# Patient Record
Sex: Female | Born: 1985 | Race: White | Hispanic: No | Marital: Married | Smoking: Former smoker
Health system: Southern US, Community
[De-identification: ages and names within clinical notes are randomized; demographics above are authoritative.]

## PROBLEM LIST (undated history)

## (undated) DIAGNOSIS — F4310 Post-traumatic stress disorder, unspecified: Secondary | ICD-10-CM

## (undated) DIAGNOSIS — F329 Major depressive disorder, single episode, unspecified: Secondary | ICD-10-CM

## (undated) DIAGNOSIS — N946 Dysmenorrhea, unspecified: Secondary | ICD-10-CM

## (undated) DIAGNOSIS — F419 Anxiety disorder, unspecified: Secondary | ICD-10-CM

## (undated) DIAGNOSIS — F32A Depression, unspecified: Secondary | ICD-10-CM

## (undated) DIAGNOSIS — Z720 Tobacco use: Secondary | ICD-10-CM

## (undated) DIAGNOSIS — R519 Headache, unspecified: Secondary | ICD-10-CM

## (undated) DIAGNOSIS — F1290 Cannabis use, unspecified, uncomplicated: Secondary | ICD-10-CM

## (undated) DIAGNOSIS — N83209 Unspecified ovarian cyst, unspecified side: Secondary | ICD-10-CM

## (undated) HISTORY — DX: Unspecified ovarian cyst, unspecified side: N83.209

## (undated) HISTORY — DX: Tobacco use: Z72.0

## (undated) HISTORY — DX: Cannabis use, unspecified, uncomplicated: F12.90

## (undated) HISTORY — DX: Dysmenorrhea, unspecified: N94.6

## (undated) HISTORY — PX: WISDOM TOOTH EXTRACTION: SHX21

---

## 2010-05-09 ENCOUNTER — Encounter (INDEPENDENT_AMBULATORY_CARE_PROVIDER_SITE_OTHER): Payer: Self-pay | Admitting: *Deleted

## 2010-05-09 DIAGNOSIS — R110 Nausea: Secondary | ICD-10-CM | POA: Insufficient documentation

## 2010-05-09 DIAGNOSIS — R109 Unspecified abdominal pain: Secondary | ICD-10-CM | POA: Insufficient documentation

## 2010-05-09 DIAGNOSIS — D72829 Elevated white blood cell count, unspecified: Secondary | ICD-10-CM | POA: Insufficient documentation

## 2010-05-13 ENCOUNTER — Ambulatory Visit: Payer: Self-pay | Admitting: Internal Medicine

## 2010-05-13 DIAGNOSIS — R519 Headache, unspecified: Secondary | ICD-10-CM | POA: Insufficient documentation

## 2010-05-13 DIAGNOSIS — R197 Diarrhea, unspecified: Secondary | ICD-10-CM | POA: Insufficient documentation

## 2010-05-13 DIAGNOSIS — R51 Headache: Secondary | ICD-10-CM

## 2010-05-13 DIAGNOSIS — F341 Dysthymic disorder: Secondary | ICD-10-CM

## 2010-05-16 ENCOUNTER — Ambulatory Visit: Payer: Self-pay | Admitting: Internal Medicine

## 2010-05-16 ENCOUNTER — Telehealth: Payer: Self-pay | Admitting: Internal Medicine

## 2010-05-16 LAB — CONVERTED CEMR LAB
Albumin: 4.2 g/dL (ref 3.5–5.2)
Amylase: 84 units/L (ref 27–131)
Basophils Absolute: 0.1 10*3/uL (ref 0.0–0.1)
Basophils Relative: 1 % (ref 0.0–3.0)
Eosinophils Absolute: 0.2 10*3/uL (ref 0.0–0.7)
Lipase: 44 units/L (ref 11.0–59.0)
Lymphocytes Relative: 35 % (ref 12.0–46.0)
MCHC: 34.2 g/dL (ref 30.0–36.0)
Neutrophils Relative %: 56.4 % (ref 43.0–77.0)
RBC: 4.19 M/uL (ref 3.87–5.11)
RDW: 12.9 % (ref 11.5–14.6)

## 2010-05-17 ENCOUNTER — Telehealth: Payer: Self-pay | Admitting: Internal Medicine

## 2010-05-17 ENCOUNTER — Encounter: Payer: Self-pay | Admitting: Internal Medicine

## 2010-05-17 ENCOUNTER — Encounter (INDEPENDENT_AMBULATORY_CARE_PROVIDER_SITE_OTHER): Payer: Self-pay | Admitting: *Deleted

## 2010-05-18 ENCOUNTER — Encounter: Payer: Self-pay | Admitting: Internal Medicine

## 2010-05-23 ENCOUNTER — Telehealth: Payer: Self-pay | Admitting: Internal Medicine

## 2010-05-25 ENCOUNTER — Telehealth (INDEPENDENT_AMBULATORY_CARE_PROVIDER_SITE_OTHER): Payer: Self-pay | Admitting: *Deleted

## 2010-05-31 LAB — CONVERTED CEMR LAB: Tissue Transglutaminase Ab, IgA: 1.4 units (ref <20)

## 2010-08-16 NOTE — Progress Notes (Signed)
Summary: Triage  Phone Note Call from Patient Call back at Home Phone [phone]   Caller: Patient Call For: Dr. Juanda Chance Reason for Call: Talk to Nurse Complaint: Headache Summary of Call: Having Endo today and has a terrible migraine...wants to know if she can take something.  returned pt.'s call at 1304, reached answering machine. Precious Haws left for pt. to call the # to admitting.Weston Brass Returned pt.'s call at 1326, pt. usually takes motrin for headache. Dr. Juanda Chance instructing pt.to dc this med. Informed pt. she may take tylenol.Weston Brass RN Initial call taken by: Karna Christmas,  May 16, 2010 12:33 PM

## 2010-08-16 NOTE — Letter (Signed)
Summary: Patient [facility] Biopsy Results  [facility]  [address] [place], [place] [address]   Phone: [phone]  Fax: [phone]        May 18, 2010 MRN: [id number]    KSENIA KUNZ [address] [place], [place]  [address]    Dear Ms. NICHOLSON,  I am pleased to inform you that the biopsies taken during your recent endoscopic examination did not show any evidence of cancer upon pathologic examination.  Additional information/recommendations:  __No further action is needed at this time.  Please follow-up with      your primary care physician for your other healthcare needs.  __ Please call [phone] to schedule a return visit to review      your condition.  _x_ Continue with the treatment plan as outlined on the day of your      exam.     Please call us if you are having persistent problems or have questions about your condition that have not been fully answered at this time.  Sincerely,  Hart Carwin MD  This letter has been electronically signed by your physician.  Appended Document: Patient Notice-Endo Biopsy Results letter mailed

## 2010-08-16 NOTE — Letter (Signed)
Summary: New Patient letter  [facility]  [address] [place], [place] [address]   Phone: [phone]  Fax: [phone]       05/09/2010 MRN:   Tara Mccullough [address] [place], [place]  [address]  [place]  Dear Ms. Mccullough,  Welcome to the Gastroenterology Division at [facility].    You are scheduled to see Dr.  Juanda Chance on 05-13-10 at 2:45p.m. on the 3rd floor at [facility], [address]  We ask that you try to arrive at our office 15 minutes prior to your appointment time to allow for check-in.  We would like you to complete the enclosed self-administered evaluation form prior to your visit and bring it with you on the day of your appointment.  We will review it with you.  Also, please bring a complete list of all your medications or, if you prefer, bring the medication bottles and we will list them.  Please bring your insurance card so that we may make a copy of it.  If your insurance requires a referral to see a specialist, please bring your referral form from your primary care physician.  Co-payments are due at the time of your visit and may be paid by cash, check or credit card.     Your office visit will consist of a consult with your physician (includes a physical exam), any laboratory testing he/she may order, scheduling of any necessary diagnostic testing (e.g. x-ray, ultrasound, CT-scan), and scheduling of a procedure (e.g. Endoscopy, Colonoscopy) if required.  Please allow enough time on your schedule to allow for any/all of these possibilities.    If you cannot keep your appointment, please call [phone] to cancel or reschedule prior to your appointment date.  This allows [place] the opportunity to schedule an appointment for another patient in need of care.  If you do not cancel or reschedule by 5 p.m. the business day prior to your appointment date, you will be charged a $50.00 late cancellation/no-show fee.    Thank you for  choosing [facility] for your medical needs.  We appreciate the opportunity to care for you.  Please visit [place] at our website  to learn more about our practice.                     Sincerely,                                                             The Gastroenterology Division

## 2010-08-16 NOTE — Letter (Signed)
Summary: EGD Instructions  [facility]  [address] [place], [place] [address]   Phone: [phone]  Fax: [phone]       Tara Mccullough    01-08-1986    MRN: [id number]       Procedure Day /Date: Monday 05/16/10     Arrival Time: 3 pm     Procedure Time: 4 pm     Location of Procedure:                    _x  _ [facility] (4th Floor)  PREPARATION FOR ENDOSCOPY   On 05/16/10 THE DAY OF THE PROCEDURE:  1.   No solid foods, milk or milk products are allowed after midnight the night before your procedure.  2.   Do not drink anything colored red or purple.  Avoid juices with pulp.  No orange juice.  3.  You may drink clear liquids until 2 pm, which is 2 hours before your procedure.                                                                                                CLEAR LIQUIDS INCLUDE: Water Jello Ice Popsicles Tea (sugar ok, no milk/cream) Powdered fruit flavored drinks Coffee (sugar ok, no milk/cream) Gatorade Juice: apple, white grape, white cranberry  Lemonade Clear bullion, consomm, broth Carbonated beverages (any kind) Strained chicken noodle soup Hard Candy   MEDICATION INSTRUCTIONS  Unless otherwise instructed, you should take regular prescription medications with a small sip of water as early as possible the morning of your procedure.                  OTHER INSTRUCTIONS  You will need a responsible adult at least 23 years of age to accompany you and drive you home.   This person must remain in the waiting room during your procedure.  Wear loose fitting clothing that is easily removed.  Leave jewelry and other valuables at home.  However, you may wish to bring a book to read or an iPod/MP3 player to listen to music as you wait for your procedure to start.  Remove all body piercing jewelry and leave at home.  Total time from sign-in until discharge is approximately 2-3 hours.  You should go  home directly after your procedure and rest.  You can resume normal activities the day after your procedure.  The day of your procedure you should not:   Drive   Make legal decisions   Operate machinery   Drink alcohol   Return to work  You will receive specific instructions about eating, activities and medications before you leave.    The above instructions have been reviewed and explained to me by   _______________________    I fully understand and can verbalize these instructions _____________________________ Date _________

## 2010-08-16 NOTE — Assessment & Plan Note (Signed)
Summary: elevated white count, abd pain--ch.   History of Present Illness Visit Type: Initial Consult Primary GI MD: Lina Sar MD Primary Provider: Bosie Clos, MD  Requesting Provider: Elvina Sidle, MD Chief Complaint: Upper abd pain, belching, nausea with one episode of vomiting, and diarrhea  History of Present Illness:   This is a 25 year old white female with a two-month history of epigastric pain which occurs postprandially. The pain bothers her daily; more often in the mornings. She has taken ibuprofen for headaches, usually 3-4 at a time. Since she was started on Nexium 40 mg a day one week ago, her symptoms have not improved at all. She has had nausea and occasional diarrhea. There is a history of anxiety and depression. She was found to have leukocytosis on blood tests which were completed last week. She had a white cell count of 13,000 and a left shift. There is no family history of gallbladder disease. She has no pre-existing GI history. Her father has problems with diverticulitis. She is a smoker. She drinks 1 20 oz soft drink/day. Her weight has increased by 15 pounds in the last 1.5 years.   GI Review of Systems    Reports abdominal pain, belching, nausea, and  vomiting.     Location of  Abdominal pain: upper abdomen.    Denies acid reflux, bloating, chest pain, dysphagia with liquids, dysphagia with solids, heartburn, loss of appetite, vomiting blood, weight loss, and  weight gain.      Reports change in bowel habits and  diarrhea.     Denies anal fissure, black tarry stools, constipation, diverticulosis, fecal incontinence, heme positive stool, hemorrhoids, irritable bowel syndrome, jaundice, light color stool, liver problems, rectal bleeding, and  rectal pain.    Current Medications (verified): 1)  Cymbalta 60 Mg Cpep (Duloxetine Hcl) .... Take 1 Tablet By Mouth Once A Day 2)  Sertraline Hcl 100 Mg Tabs (Sertraline Hcl) .... Take 1 Tablet By Mouth Once A Day 3)   Depo-Provera 400 Mg/ml Susp (Medroxyprogesterone Acetate) .... Inject Im Once Per Month 4)  Nexium 40 Mg Cpdr (Esomeprazole Magnesium) .... One Tablet By Mouth Once Daily  Allergies (verified): No Known Drug Allergies  Past History:  Past Medical History: DIARRHEA (ICD-787.91) HEADACHE, CHRONIC (ICD-784.0) ANXIETY DEPRESSION (ICD-300.4) NAUSEA (ICD-787.02) LEUKOCYTOSIS (ICD-288.60) ABDOMINAL PAIN, UNSPECIFIED SITE (ICD-789.00)   GERD  Past Surgical History: Unremarkable  Family History: Family History of Colon Polyps:Father  No FH of Colon Cancer:  Social History: Customer Service Rep Married No Childern Patient currently smokes.  Alcohol Use - no Daily Caffeine Use: 2 daily  Illicit Drug Use - no Smoking Status:  current Drug Use:  no  Review of Systems       The patient complains of anxiety-new, back pain, depression-new, fatigue, fever, and headaches-new.  The patient denies allergy/sinus, anemia, arthritis/joint pain, blood in urine, breast changes/lumps, change in vision, confusion, cough, coughing up blood, fainting, hearing problems, heart murmur, heart rhythm changes, itching, menstrual pain, muscle pains/cramps, night sweats, nosebleeds, pregnancy symptoms, shortness of breath, skin rash, sleeping problems, sore throat, swelling of feet/legs, swollen lymph glands, thirst - excessive, urination - excessive, urination changes/pain, urine leakage, vision changes, and voice change.         Pertinent positive and negative review of systems were noted in the above HPI. All other ROS was otherwise negative.   Vital Signs:  Patient profile:   25 year old female Height:      63 inches Weight:  133 pounds BMI:     23.65 BSA:     1.63 Pulse rate:   88 / minute Pulse rhythm:   regular BP sitting:   110 / 64  (left arm) Cuff size:   regular  Vitals Entered By: Ok Anis CMA (May 13, 2010 3:07 PM)  Physical Exam  General:  Well developed, well nourished,  no acute distress. Eyes:  PERRLA, no icterus. Mouth:  No deformity or lesions, dentition normal. Neck:  Supple; no masses or thyromegaly. Lungs:  Clear throughout to auscultation. Heart:  Regular rate and rhythm; no murmurs, rubs,  or bruits. Abdomen:  soft abdomen slightly protuberant with tenderness in midline in the epigastric area. There is no rebound and no pulsations. Right upper quadrant is mildly tender. Lower abdomen is unremarkable. Rectal:  soft Hemoccult negative stool. Extremities:  No clubbing, cyanosis, edema or deformities noted. Skin:  Intact without significant lesions or rashes. Psych:  Alert and cooperative. Normal mood and affect.   Impression & Recommendations:  Problem # 1:  LEUKOCYTOSIS (ICD-288.60) Patient predominantly has epigastric pain which is mostly postprandial. Patient has been taking excess NSAID's. 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 We need to rule out H. pylori gastritis or peptic ulcer disease. Gallbladder dysfunction would be low on my list.  Problem # 2:  ANXIETY DEPRESSION (ICD-300.4) Patient is on sertraline 100 mg once a day and Cymbalta.  Problem # 3:  DIARRHEA (ICD-787.91) Patient has symptoms suggestive of irritable bowel syndrome. She is Hemoccult-negative today.  Other Orders: T-Tissue Transglutamase Ab IgA [phone]) T-Sprue Panel (Celiac Disease Aby Eval) (83516x3/[address]) TLB-IgA (Immunoglobulin A) (82784-IGA) T- * Misc. Laboratory test [phone]) TLB-CBC Platelet - w/Differential (85025-CBCD) TLB-H. Pylori Abs(Helicobacter Pylori) (86677-HELICO) TLB-Amylase (82150-AMYL) TLB-Lipase (83690-LIPASE) TLB-Hepatic/Liver Function Pnl (80076-HEPATIC)  Patient Instructions: 1)  Hold Ibuprofen. 2)  Continue Nexium 40 mg daily. A prescription has been called in. 3)  Bentyl 10 mg p.o. before meals b.i.d. 4)  Upper endoscopy and biopsies has been scheduled. 5)  CBC liver function tests, amylase and metabolic panel including liver  function tests today as well as HPylori test and ttg testing. 6)  Substitute Tylenol for Ibuprofen. 7)  Copy sent to : Dr Kirtland Bouchard.Rice, Dr Milus Glazier 8)  The medication list was reviewed and reconciled.  All changed / newly prescribed medications were explained.  A complete medication list was provided to the patient / caregiver. Prescriptions: BENTYL 10 MG CAPS (DICYCLOMINE HCL) Take 1 capsule by mouth two times a day before meals.  #60 x 2   Entered by:   Lamona Curl CMA (AAMA)   Authorized by:   Hart Carwin MD   Signed by:   Lamona Curl CMA (AAMA) on 05/13/2010   Method used:   Electronically to        [facility] # [phone]* (retail)       [address] [place], [place]  [address]       Ph: [phone]       Fax: [phone]   RxID:   [id number] NEXIUM 40 MG CPDR (ESOMEPRAZOLE MAGNESIUM) one tablet by mouth once daily  #30 x 3   Entered by:   Lamona Curl CMA (AAMA)   Authorized by:   Hart Carwin MD   Signed by:   Lamona Curl CMA (AAMA) on 05/13/2010   Method used:   Electronically to        [facility]  [address] # [phone]* (retail)       [address] [place], [place]  [address]       Ph: [phone]       Fax: [phone]   RxID:   [id number]

## 2010-08-16 NOTE — Procedures (Signed)
Summary: Upper Endoscopy  Patient: Tara Mccullough Note: All result statuses are Final unless otherwise noted.  Tests: (1) Upper Endoscopy (EGD)   EGD Upper Endoscopy       DONE     [facility]     [address]     [place], [place]  [address]           ENDOSCOPY PROCEDURE REPORT           PATIENT:  Tara Mccullough, Tara Mccullough  MR#:  [id number]     BIRTHDATE:  Oct 28, 1985, 24 yrs. old  GENDER:  female           ENDOSCOPIST:  Hedwig Morton. Juanda Chance, MD     Referred by:  Elvina Sidle, M.D.           PROCEDURE DATE:  05/16/2010     PROCEDURE:  EGD with biopsy, [address]     ASA CLASS:  Class I     INDICATIONS:  nausea and vomiting, abdominal pain daily epigastric     pain refractory to Nexiem 40 mg/day. Took NSAID's.Hx anxiety- on     Sertraline and Cymbalta           MEDICATIONS:   Versed 10 mg, Fentanyl 100 mcg     TOPICAL ANESTHETIC:  Exactacain Spray           DESCRIPTION OF PROCEDURE:   After the risks benefits and     alternatives of the procedure were thoroughly explained, informed     consent was obtained.  The LB [id number] endoscope was     introduced through the mouth and advanced to the second portion of     the duodenum, without limitations.  The instrument was slowly     withdrawn as the mucosa was fully examined.     <<PROCEDUREIMAGES>>           The upper, middle, and distal third of the esophagus were     carefully inspected and no abnormalities were noted. The z-line     was well seen at the GEJ. The endoscope was pushed into the fundus     which was normal including a retroflexed view. The antrum,gastric     body, first and second part of the duodenum were unremarkable.     With standard forceps, a biopsy was obtained and sent to pathology.     biopsy from duodenum and from gastric antrum to r/o H (see image1,     image2, image3, image4, and image5).Pylori    Retroflexed views     revealed no abnormalities.    The scope was then withdrawn from     the  patient and the procedure completed.           COMPLICATIONS:  None           ENDOSCOPIC IMPRESSION:     1) Normal EGD     RECOMMENDATIONS:     1) Await biopsy results     Bentyl 10 mg po tid (ac), Phenergan 12.5 mg, # 20 1 po q 6 hrs     prn nausea,           REPEAT EXAM:  In 0 year(s) for.           ______________________________     Hedwig Morton. Juanda Chance, MD           CC:           n.     eSIGNED:   Hedwig Morton.  Brodie at 05/16/2010 04:20 PM           Juliene Pina, [id number]  Note: An exclamation mark (!) indicates a result that was not dispersed into the flowsheet. Document Creation Date: 05/16/2010 4:21 PM _______________________________________________________________________  (1) Order result status: Final Collection or observation date-time: 05/16/2010 16:08 Requested date-time:  Receipt date-time:  Reported date-time:  Referring Physician:   Ordering Physician: Lina Sar [phone]) Specimen Source:  Source: Launa Grill Order Number: [id number] Lab site:

## 2010-08-16 NOTE — Miscellaneous (Signed)
  Clinical Lists Changes  Medications: Removed medication of BENTYL 10 MG CAPS (DICYCLOMINE HCL) Take 1 capsule by mouth two times a day before meals.

## 2010-08-16 NOTE — Progress Notes (Signed)
Summary: results ?'s  Phone Note Call from Patient Call back at Home Phone [phone]   Caller: Patient Call For: Dr. Juanda Chance Reason for Call: Talk to Nurse Summary of Call: would like to discuss results Initial call taken by: Vallarie Mare,  May 23, 2010 9:10 AM  Follow-up for Phone Call        Patient wanted to know results of "test for infedtion". Per pathology report H. pylori is negative. Patient given the information as per path report 05/17/10. Follow-up by: Jesse Fall RN,  May 23, 2010 9:20 AM

## 2010-08-16 NOTE — Miscellaneous (Signed)
  Clinical Lists Changes 

## 2010-08-16 NOTE — Progress Notes (Signed)
Summary: throat pain post EGD  Phone Note Call from Patient Call back at Home Phone [phone]   Caller: Patient Call For: Dr. Juanda Chance Reason for Call: Talk to Nurse Summary of Call: throat still very sore from EGD and pt would like something for the pain... [facility] [address] Initial call taken by: Vallarie Mare,  May 17, 2010 10:27 AM  Follow-up for Phone Call        Message left for patient to call back re: sore throat.Jesse Fall RN  May 17, 2010 10:32 AM  Patient returned call. States she has a "sore throat." She states she can hardly swallow and it feels"like a knot in my throat." Hurts to touch outside of throat and she is hoarse. Dr. Juanda Chance recommends salt water gargles and chloraseptic spray. Patient notified. Also, notified patient that her rx for Nexium has been changed to Omeprazole.   Follow-up by: Jesse Fall RN,  May 17, 2010 11:07 AM

## 2010-08-16 NOTE — Letter (Signed)
Summary: -CBC  -CBC   Imported By: Lamona Curl CMA (AAMA) 05/09/2010 15:36:39  _____________________________________________________________________  External Attachment:    Type:   Image     Comment:   External Document

## 2010-08-16 NOTE — Miscellaneous (Signed)
Summary: phenergan rx.  Clinical Lists Changes  Medications: Added new medication of PROMETHAZINE HCL 12.5 MG TABS (PROMETHAZINE HCL) take 1 tab every 6 hours as needed for nausea. - Signed Added new medication of DONNATAL   TABS (BELLADONNA ALK-PHENOBARBITAL) take 1 tab two times a day by mouth . - Signed Rx of PROMETHAZINE HCL 12.5 MG TABS (PROMETHAZINE HCL) take 1 tab every 6 hours as needed for nausea.;  #20 x 0;  Signed;  Entered by: Doristine Church RN II;  Authorized by: Hart Carwin MD;  Method used: Electronically to [facility] # [phone]*, [address] [place], [place], [place]  [address], Ph: [phone], Fax: [phone] Rx of DONNATAL   TABS (BELLADONNA ALK-PHENOBARBITAL) take 1 tab two times a day by mouth .;  #60 x 2;  Signed;  Entered by: Doristine Church RN II;  Authorized by: Hart Carwin MD;  Method used: Electronically to [facility] # [phone]*, [address], [place], [place], [place]  [address], Ph: [phone], Fax: [phone]    Prescriptions: DONNATAL   TABS (BELLADONNA ALK-PHENOBARBITAL) take 1 tab two times a day by mouth .  #60 x 2   Entered by:   Doristine Church RN II   Authorized by:   Hart Carwin MD   Signed by:   Doristine Church RN II on 05/16/2010   Method used:   Electronically to        [facility] # [phone]* (retail)       [address] [place], [place]  [address]       Ph: [phone]       Fax: [phone]   RxID:   [id number] PROMETHAZINE HCL 12.5 MG TABS (PROMETHAZINE HCL) take 1 tab every 6 hours as needed for nausea.  #20 x 0   Entered by:   Doristine Church RN II   Authorized by:   Hart Carwin MD   Signed by:   Doristine Church RN II on 05/16/2010   Method used:   Electronically to        [facility] # [phone]* (retail)       [address] [place], [place]  [address]       Ph: [phone]       Fax: [phone]   RxID:   [id number]

## 2010-08-16 NOTE — Progress Notes (Signed)
  Phone Note Other Incoming   Request: Send information Summary of Call: Request for records received from [facility]. Faxed 11 pages to [phone].

## 2010-08-16 NOTE — Miscellaneous (Signed)
Summary: Change PPI     Clinical Lists Changes  Medications: Changed medication from NEXIUM 40 MG CPDR (ESOMEPRAZOLE MAGNESIUM) one tablet by mouth once daily to PRILOSEC 20 MG CPDR (OMEPRAZOLE) Take 1 tablet by mouth once a day. PHARMACY-PLEASE D/C RX FOR NEXIUM....INSURANCE WILL NOT COVER! - Signed Rx of PRILOSEC 20 MG CPDR (OMEPRAZOLE) Take 1 tablet by mouth once a day. PHARMACY-PLEASE D/C RX FOR NEXIUM....INSURANCE WILL NOT COVER!;  #30 x 3;  Signed;  Entered by: Lamona Curl CMA (AAMA);  Authorized by: Hart Carwin MD;  Method used: Electronically to [facility] # [phone]*, [address], [place], [place], [place]  [address], Ph: [phone], Fax: [phone]    Prescriptions: PRILOSEC 20 MG CPDR (OMEPRAZOLE) Take 1 tablet by mouth once a day. PHARMACY-PLEASE D/C RX FOR NEXIUM....INSURANCE WILL NOT COVER!  #30 x 3   Entered by:   Lamona Curl CMA (AAMA)   Authorized by:   Hart Carwin MD   Signed by:   Lamona Curl CMA (AAMA) on 05/17/2010   Method used:   Electronically to        [facility] # [phone]* (retail)       [address] [place], [place]  [address]       Ph: [phone]       Fax: [phone]   RxID:   [id number]

## 2015-02-02 ENCOUNTER — Encounter: Payer: Self-pay | Admitting: Internal Medicine

## 2015-07-13 ENCOUNTER — Ambulatory Visit (INDEPENDENT_AMBULATORY_CARE_PROVIDER_SITE_OTHER): Payer: Medicaid Other

## 2015-07-13 VITALS — BP 114/73 | HR 112 | Wt 107.7 lb

## 2015-07-13 DIAGNOSIS — Z3687 Encounter for antenatal screening for uncertain dates: Secondary | ICD-10-CM

## 2015-07-13 DIAGNOSIS — Z3491 Encounter for supervision of normal pregnancy, unspecified, first trimester: Secondary | ICD-10-CM

## 2015-07-13 DIAGNOSIS — Z720 Tobacco use: Secondary | ICD-10-CM

## 2015-07-13 DIAGNOSIS — Z36 Encounter for antenatal screening of mother: Secondary | ICD-10-CM

## 2015-07-13 DIAGNOSIS — F1210 Cannabis abuse, uncomplicated: Secondary | ICD-10-CM

## 2015-07-13 NOTE — Patient Instructions (Signed)
.Pregnancy and Zika Virus Disease Zika virus disease, or Zika, is an illness that can spread to people from mosquitoes that carry the virus. It may also spread from person to person through infected body fluids. Zika first occurred in [place], but recently it has spread to new areas. The virus occurs in tropical climates. The location of Zika continues to change. Most people who become infected with Zika virus do not develop serious illness. However, Zika may cause birth defects in an unborn baby whose mother is infected with the virus. It may also increase the risk of miscarriage. WHAT ARE THE SYMPTOMS OF ZIKA VIRUS DISEASE? In many cases, people who have been infected with Zika virus do not develop any symptoms. If symptoms appear, they usually start about a week after the person is infected. Symptoms are usually mild. They may include:  Fever.  Rash.  Red eyes.  Joint pain. HOW DOES ZIKA VIRUS DISEASE SPREAD? The main way that Zika virus spreads is through the bite of a certain type of mosquito. Unlike most types of mosquitos, which bite only at night, the type of mosquito that carries Zika virus bites both at night and during the day. Zika virus can also spread through sexual contact, through a blood transfusion, and from a mother to her baby before or during birth. Once you have had Zika virus disease, it is unlikely that you will get it again. CAN I PASS ZIKA TO MY BABY DURING PREGNANCY? Yes, Zika can pass from a mother to her baby before or during birth. WHAT PROBLEMS CAN ZIKA CAUSE FOR MY BABY? A woman who is infected with Zika virus while pregnant is at risk of having her baby born with a condition in which the brain or head is smaller than expected (microcephaly). Babies who have microcephaly can have developmental delays, seizures, hearing problems, and vision problems. Having Zika virus disease during pregnancy can also increase the risk of miscarriage. HOW CAN ZIKA VIRUS DISEASE BE  PREVENTED? There is no vaccine to prevent Zika. The best way to prevent the disease is to avoid infected mosquitoes and avoid exposure to body fluids that can spread the virus. Avoid any possible exposure to Zika by taking the following precautions. For women and their sex partners:  Avoid traveling to high-risk areas. The locations where [place] is being reported change often. To identify high-risk areas, check the CDC travel website: http://davidson-gomez.com/  If you or your sex partner must travel to a high-risk area, talk with a health care provider before and after traveling.  Take all precautions to avoid mosquito bites if you live in, or travel to, any of the high-risk areas. Insect repellents are safe to use during pregnancy.  Ask your health care provider when it is safe to have sexual contact. For women:  If you are pregnant or trying to become pregnant, avoid sexual contact with persons who may have been exposed to [place] virus, persons who have possible symptoms of Zika, or persons whose history you are unsure about. If you choose to have sexual contact with someone who may have been exposed to [place] virus, use condoms correctly during the entire duration of sexual activity, every time. Do not share sexual devices, as you may be exposed to body fluids.  Ask your health care provider about when it is safe to attempt pregnancy after a possible exposure to Zika virus. WHAT STEPS SHOULD I TAKE TO AVOID MOSQUITO BITES? Take these steps to avoid mosquito bites when you are  in a high-risk area:  Wear loose clothing that covers your arms and legs.  Limit your outdoor activities.  Do not open windows unless they have window screens.  Sleep under mosquito nets.  Use insect repellent. The best insect repellents have:  DEET, picaridin, oil of lemon eucalyptus (OLE), or IR3535 in them.  Higher amounts of an active ingredient in them.  Remember that insect repellents are safe to use  during pregnancy.  Do not use OLE on children who are younger than 36 years of age. Do not use insect repellent on babies who are younger than 82 months of age.  Cover your child's stroller with mosquito netting. Make sure the netting fits snugly and that any loose netting does not cover your child's mouth or nose. Do not use a blanket as a mosquito-protection cover.  Do not apply insect repellent underneath clothing.  If you are using sunscreen, apply the sunscreen before applying the insect repellent.  Treat clothing with permethrin. Do not apply permethrin directly to your skin. Follow label directions for safe use.  Get rid of standing water, where mosquitoes may reproduce. Standing water is often found in items such as buckets, bowls, animal food dishes, and flowerpots. When you return from traveling to any high-risk area, continue taking actions to protect yourself against mosquito bites for 3 weeks, even if you show no signs of illness. This will prevent spreading Zika virus to uninfected mosquitoes. WHAT SHOULD I KNOW ABOUT THE SEXUAL TRANSMISSION OF ZIKA? People can spread Zika to their sexual partners during vaginal, anal, or oral sex, or by sharing sexual devices. Many people with [place] do not develop symptoms, so a person could spread the disease without knowing that they are infected. The greatest risk is to women who are pregnant or who may become pregnant. Zika virus can live longer in semen than it can live in blood. Couples can prevent sexual transmission of the virus by:  Using condoms correctly during the entire duration of sexual activity, every time. This includes vaginal, anal, and oral sex.  Not sharing sexual devices. Sharing increases your risk of being exposed to body fluid from another person.  Avoiding all sexual activity until your health care provider says it is safe. SHOULD I BE TESTED FOR ZIKA VIRUS? A sample of your blood can be tested for Zika virus. A pregnant  woman should be tested if she may have been exposed to the virus or if she has symptoms of Zika. She may also have additional tests done during her pregnancy, such ultrasound testing. Talk with your health care provider about which tests are recommended.   This information is not intended to replace advice given to you by your health care provider. Make sure you discuss any questions you have with your health care provider.   Document Released: 03/24/2015 Document Reviewed: 03/17/2015 Elsevier Interactive Patient Education [employer] First Trimester of Pregnancy The first trimester of pregnancy is from week 1 until the end of week 12 (months 1 through 3). A week after a sperm fertilizes an egg, the egg will implant on the wall of the uterus. This embryo will begin to develop into a baby. Genes from you and your partner are forming the baby. The female genes determine whether the baby is a boy or a girl. At 6-8 weeks, the eyes and face are formed, and the heartbeat can be seen on ultrasound. At the end of 12 weeks, all the baby's organs are formed.  Now  that you are pregnant, you will want to do everything you can to have a healthy baby. Two of the most important things are to get good prenatal care and to follow your health care provider's instructions. Prenatal care is all the medical care you receive before the baby's birth. This care will help prevent, find, and treat any problems during the pregnancy and childbirth. BODY CHANGES Your body goes through many changes during pregnancy. The changes vary from woman to woman.   You may gain or lose a couple of pounds at first.  You may feel sick to your stomach (nauseous) and throw up (vomit). If the vomiting is uncontrollable, call your health care provider.  You may tire easily.  You may develop headaches that can be relieved by medicines approved by your health care provider.  You may urinate more often. Painful urination may mean you have  a bladder infection.  You may develop heartburn as a result of your pregnancy.  You may develop constipation because certain hormones are causing the muscles that push waste through your intestines to slow down.  You may develop hemorrhoids or swollen, bulging veins (varicose veins).  Your breasts may begin to grow larger and become tender. Your nipples may stick out more, and the tissue that surrounds them (areola) may become darker.  Your gums may bleed and may be sensitive to brushing and flossing.  Dark spots or blotches (chloasma, mask of pregnancy) may develop on your face. This will likely fade after the baby is born.  Your menstrual periods will stop.  You may have a loss of appetite.  You may develop cravings for certain kinds of food.  You may have changes in your emotions from day to day, such as being excited to be pregnant or being concerned that something may go wrong with the pregnancy and baby.  You may have more vivid and strange dreams.  You may have changes in your hair. These can include thickening of your hair, rapid growth, and changes in texture. Some women also have hair loss during or after pregnancy, or hair that feels dry or thin. Your hair will most likely return to normal after your baby is born. WHAT TO EXPECT AT YOUR PRENATAL VISITS During a routine prenatal visit:  You will be weighed to make sure you and the baby are growing normally.  Your blood pressure will be taken.  Your abdomen will be measured to track your baby's growth.  The fetal heartbeat will be listened to starting around week 10 or 12 of your pregnancy.  Test results from any previous visits will be discussed. Your health care provider may ask you:  How you are feeling.  If you are feeling the baby move.  If you have had any abnormal symptoms, such as leaking fluid, bleeding, severe headaches, or abdominal cramping.  If you are using any tobacco products, including  cigarettes, chewing tobacco, and electronic cigarettes.  If you have any questions. Other tests that may be performed during your first trimester include:  Blood tests to find your blood type and to check for the presence of any previous infections. They will also be used to check for low iron levels (anemia) and Rh antibodies. Later in the pregnancy, blood tests for diabetes will be done along with other tests if problems develop.  Urine tests to check for infections, diabetes, or protein in the urine.  An ultrasound to confirm the proper growth and development of the baby.  An  amniocentesis to check for possible genetic problems.  Fetal screens for spina bifida and Down syndrome.  You may need other tests to make sure you and the baby are doing well.  HIV (human immunodeficiency virus) testing. Routine prenatal testing includes screening for HIV, unless you choose not to have this test. HOME CARE INSTRUCTIONS  Medicines  Follow your health care provider's instructions regarding medicine use. Specific medicines may be either safe or unsafe to take during pregnancy.  Take your prenatal vitamins as directed.  If you develop constipation, try taking a stool softener if your health care provider approves. Diet  Eat regular, well-balanced meals. Choose a variety of foods, such as meat or vegetable-based protein, fish, milk and low-fat dairy products, vegetables, fruits, and whole grain breads and cereals. Your health care provider will help you determine the amount of weight gain that is right for you.  Avoid raw meat and uncooked cheese. These carry germs that can cause birth defects in the baby.  Eating four or five small meals rather than three large meals a day may help relieve nausea and vomiting. If you start to feel nauseous, eating a few soda crackers can be helpful. Drinking liquids between meals instead of during meals also seems to help nausea and vomiting.  If you develop  constipation, eat more high-fiber foods, such as fresh vegetables or fruit and whole grains. Drink enough fluids to keep your urine clear or pale yellow. Activity and Exercise  Exercise only as directed by your health care provider. Exercising will help you:  Control your weight.  Stay in shape.  Be prepared for labor and delivery.  Experiencing pain or cramping in the lower abdomen or low back is a good sign that you should stop exercising. Check with your health care provider before continuing normal exercises.  Try to avoid standing for long periods of time. Move your legs often if you must stand in one place for a long time.  Avoid heavy lifting.  Wear low-heeled shoes, and practice good posture.  You may continue to have sex unless your health care provider directs you otherwise. Relief of Pain or Discomfort  Wear a good support bra for breast tenderness.   Take warm sitz baths to soothe any pain or discomfort caused by hemorrhoids. Use hemorrhoid cream if your health care provider approves.   Rest with your legs elevated if you have leg cramps or low back pain.  If you develop varicose veins in your legs, wear support hose. Elevate your feet for 15 minutes, 3-4 times a day. Limit salt in your diet. Prenatal Care  Schedule your prenatal visits by the twelfth week of pregnancy. They are usually scheduled monthly at first, then more often in the last 2 months before delivery.  Write down your questions. Take them to your prenatal visits.  Keep all your prenatal visits as directed by your health care provider. Safety  Wear your seat belt at all times when driving.  Make a list of emergency phone numbers, including numbers for family, friends, the hospital, and police and fire departments. General Tips  Ask your health care provider for a referral to a local prenatal education class. Begin classes no later than at the beginning of month 6 of your pregnancy.  Ask for  help if you have counseling or nutritional needs during pregnancy. Your health care provider can offer advice or refer you to specialists for help with various needs.  Do not use hot tubs, steam rooms,  or saunas.  Do not douche or use tampons or scented sanitary pads.  Do not cross your legs for long periods of time.  Avoid cat litter boxes and soil used by cats. These carry germs that can cause birth defects in the baby and possibly loss of the fetus by miscarriage or stillbirth.  Avoid all smoking, herbs, alcohol, and medicines not prescribed by your health care provider. Chemicals in these affect the formation and growth of the baby.  Do not use any tobacco products, including cigarettes, chewing tobacco, and electronic cigarettes. If you need help quitting, ask your health care provider. You may receive counseling support and other resources to help you quit.  Schedule a dentist appointment. At home, brush your teeth with a soft toothbrush and be gentle when you floss. SEEK MEDICAL CARE IF:   You have dizziness.  You have mild pelvic cramps, pelvic pressure, or nagging pain in the abdominal area.  You have persistent nausea, vomiting, or diarrhea.  You have a bad smelling vaginal discharge.  You have pain with urination.  You notice increased swelling in your face, hands, legs, or ankles. SEEK IMMEDIATE MEDICAL CARE IF:   You have a fever.  You are leaking fluid from your vagina.  You have spotting or bleeding from your vagina.  You have severe abdominal cramping or pain.  You have rapid weight gain or loss.  You vomit blood or material that looks like coffee grounds.  You are exposed to [place] measles and have never had them.  You are exposed to fifth disease or chickenpox.  You develop a severe headache.  You have shortness of breath.  You have any kind of trauma, such as from a fall or a car accident.   This information is not intended to replace advice  given to you by your health care provider. Make sure you discuss any questions you have with your health care provider.   Document Released: 06/27/2001 Document Revised: 07/24/2014 Document Reviewed: 05/13/2013 Elsevier Interactive Patient Education 2016 [employer] How a Baby Grows During Pregnancy Pregnancy begins when a female's sperm enters a female's egg (fertilization). This happens in one of the tubes (fallopian tubes) that connect the ovaries to the womb (uterus). The fertilized egg is called an embryo until it reaches 10 weeks. From 10 weeks until birth, it is called a fetus. The fertilized egg moves down the fallopian tube to the uterus. Then it implants into the lining of the uterus and begins to grow. The developing fetus receives oxygen and nutrients through the pregnant woman's bloodstream and the tissues that grow (placenta) to support the fetus. The placenta is the life support system for the fetus. It provides nutrition and removes waste. Learning as much as you can about your pregnancy and how your baby is developing can help you enjoy the experience. It can also make you aware of when there might be a problem and when to ask questions. HOW LONG DOES A TYPICAL PREGNANCY LAST? A pregnancy usually lasts 280 days, or about 40 weeks. Pregnancy is divided into three trimesters:  First trimester: 0-13 weeks.  Second trimester: 14-27 weeks.  Third trimester: 28-40 weeks. The day when your baby is considered ready to be born (full term) is your estimated date of delivery. HOW DOES MY BABY DEVELOP MONTH BY MONTH? First month  The fertilized egg attaches to the inside of the uterus.  Some cells will form the placenta. Others will form the fetus.  The arms, legs,  brain, spinal cord, lungs, and heart begin to develop.  At the end of the first month, the heart begins to beat. Second month  The bones, inner ear, eyelids, hands, and feet form.  The genitals develop.  By the end  of 8 weeks, all major organs are developing. Third month  All of the internal organs are forming.  Teeth develop below the gums.  Bones and muscles begin to grow. The spine can flex.  The skin is transparent.  Fingernails and toenails begin to form.  Arms and legs continue to grow longer, and hands and feet develop.  The fetus is about 3 in (7.6 cm) long. Fourth month  The placenta is completely formed.  The external sex organs, neck, outer ear, eyebrows, eyelids, and fingernails are formed.  The fetus can hear, swallow, and move its arms and legs.  The kidneys begin to produce urine.  The skin is covered with a white waxy coating (vernix) and very fine hair (lanugo). Fifth month  The fetus moves around more and can be felt for the first time (quickening).  The fetus starts to sleep and wake up and may begin to suck its finger.  The nails grow to the end of the fingers.  The organ in the digestive system that makes bile (gallbladder) functions and helps to digest the nutrients.  If your baby is a girl, eggs are present in her ovaries. If your baby is a boy, testicles start to move down into his scrotum. Sixth month  The lungs are formed, but the fetus is not yet able to breathe.  The eyes open. The brain continues to develop.  Your baby has fingerprints and toe prints. Your baby's hair grows thicker.  At the end of the second trimester, the fetus is about 9 in (22.9 cm) long. Seventh month  The fetus kicks and stretches.  The eyes are developed enough to sense changes in light.  The hands can make a grasping motion.  The fetus responds to sound. Eighth month  All organs and body systems are fully developed and functioning.  Bones harden and taste buds develop. The fetus may hiccup.  Certain areas of the brain are still developing. The skull remains soft. Ninth month  The fetus gains about  lb (0.23 kg) each week.  The lungs are fully  developed.  Patterns of sleep develop.  The fetus's head typically moves into a head-down position (vertex) in the uterus to prepare for birth. If the buttocks move into a vertex position instead, the baby is breech.  The fetus weighs 6-9 lbs (2.72-4.08 kg) and is 19-20 in (48.26-50.8 cm) long. WHAT CAN I DO TO HAVE A HEALTHY PREGNANCY AND HELP MY BABY DEVELOP? Eating and Drinking  Eat a healthy diet.  Talk with your health care provider to make sure that you are getting the nutrients that you and your baby need.  Visit www.DisposableNylon.be to learn about creating a healthy diet.  Gain a healthy amount of weight during pregnancy as advised by your health care provider. This is usually 25-35 pounds. You may need to:  Gain more if you were underweight before getting pregnant or if you are pregnant with more than one baby.  Gain less if you were overweight or obese when you got pregnant. Medicines and Vitamins  Take prenatal vitamins as directed by your health care provider. These include vitamins such as folic acid, iron, calcium, and vitamin D. They are important for healthy development.  Take  medicines only as directed by your health care provider. Read labels and ask a pharmacist or your health care provider whether over-the-counter medicines, supplements, and prescription drugs are safe to take during pregnancy. Activities  Be physically active as advised by your health care provider. Ask your health care provider to recommend activities that are safe for you to do, such as walking or swimming.  Do not participate in strenuous or extreme sports. Lifestyle  Do not drink alcohol.  Do not use any tobacco products, including cigarettes, chewing tobacco, or electronic cigarettes. If you need help quitting, ask your health care provider.  Do not use illegal drugs. Safety  Avoid exposure to mercury, lead, or other heavy metals. Ask your health care provider about common sources of  these heavy metals.  Avoid listeria infection during pregnancy. Follow these precautions:  Do not eat soft cheeses or deli meats.  Do not eat hot dogs unless they have been warmed up to the point of steaming, such as in the microwave oven.  Do not drink unpasteurized milk.  Avoid toxoplasmosis infection during pregnancy. Follow these precautions:  Do not change your cat's litter box, if you have a cat. Ask someone else to do this for you.  Wear gardening gloves while working in the yard. General Instructions  Keep all follow-up visits as directed by your health care provider. This is important. This includes prenatal care and screening tests.  Manage any chronic health conditions. Work closely with your health care provider to keep conditions, such as diabetes, under control. HOW DO I KNOW IF MY BABY IS DEVELOPING WELL? At each prenatal visit, your health care provider will do several different tests to check on your health and keep track of your baby's development. These include:  Fundal height.  Your health care provider will measure your growing belly from top to bottom using a tape measure.  Your health care provider will also feel your belly to determine your baby's position.  Heartbeat.  An ultrasound in the first trimester can confirm pregnancy and show a heartbeat, depending on how far along you are.  Your health care provider will check your baby's heart rate at every prenatal visit.  As you get closer to your delivery date, you may have regular fetal heart rate monitoring to make sure that your baby is not in distress.  Second trimester ultrasound.  This ultrasound checks your baby's development. It also indicates your baby's gender. WHAT SHOULD I DO IF I HAVE CONCERNS ABOUT MY BABY'S DEVELOPMENT? Always talk with your health care provider about any concerns that you may have.   This information is not intended to replace advice given to you by your health care  provider. Make sure you discuss any questions you have with your health care provider.   Document Released: 12/20/2007 Document Revised: 03/24/2015 Document Reviewed: 12/10/2013 Elsevier Interactive Patient Education 2016 [employer].  Commonly Asked Questions During Pregnancy  Cats: A parasite can be excreted in cat feces.  To avoid exposure you need to have another person empty the little box.  If you must empty the litter box you will need to wear gloves.  Wash your hands after handling your cat.  This parasite can also be found in raw or undercooked meat so this should also be avoided.  Colds, Sore Throats, Flu: Please check your medication sheet to see what you can take for symptoms.  If your symptoms are unrelieved by these medications please call the office.  Dental Work:  Most any dental work Agricultural consultant recommends is permitted.  X-rays should only be taken during the first trimester if absolutely necessary.  Your abdomen should be shielded with a lead apron during all x-rays.  Please notify your provider prior to receiving any x-rays.  Novocaine is fine; gas is not recommended.  If your dentist requires a note from [place] prior to dental work please call the office and we will provide one for you.  Exercise: Exercise is an important part of staying healthy during your pregnancy.  You may continue most exercises you were accustomed to prior to pregnancy.  Later in your pregnancy you will most likely notice you have difficulty with activities requiring balance like riding a bicycle.  It is important that you listen to your body and avoid activities that put you at a higher risk of falling.  Adequate rest and staying well hydrated are a must!  If you have questions about the safety of specific activities ask your provider.    Exposure to Children with illness: Try to avoid obvious exposure; report any symptoms to [place] when noted,  If you have chicken pos, red measles or mumps, you should be immune  to these diseases.   Please do not take any vaccines while pregnant unless you have checked with your OB provider.  Fetal Movement: After 28 weeks we recommend you do "kick counts" twice daily.  Lie or sit down in a calm quiet environment and count your baby movements "kicks".  You should feel your baby at least 10 times per hour.  If you have not felt 10 kicks within the first hour get up, walk around and have something sweet to eat or drink then repeat for an additional hour.  If count remains less than 10 per hour notify your provider.  Fumigating: Follow your pest control agent's advice as to how long to stay out of your home.  Ventilate the area well before re-entering.  Hemorrhoids:   Most over-the-counter preparations can be used during pregnancy.  Check your medication to see what is safe to use.  It is important to use a stool softener or fiber in your diet and to drink lots of liquids.  If hemorrhoids seem to be getting worse please call the office.   Hot Tubs:  Hot tubs Jacuzzis and saunas are not recommended while pregnant.  These increase your internal body temperature and should be avoided.  Intercourse:  Sexual intercourse is safe during pregnancy as long as you are comfortable, unless otherwise advised by your provider.  Spotting may occur after intercourse; report any bright red bleeding that is heavier than spotting.  Labor:  If you know that you are in labor, please go to the hospital.  If you are unsure, please call the office and let us help you decide what to do.  Lifting, straining, etc:  If your job requires heavy lifting or straining please check with your provider for any limitations.  Generally, you should not lift items heavier than that you can lift simply with your hands and arms (no back muscles)  Painting:  Paint fumes do not harm your pregnancy, but may make you ill and should be avoided if possible.  Latex or water based paints have less odor than oils.  Use adequate  ventilation while painting.  Permanents & Hair Color:  Chemicals in hair dyes are not recommended as they cause increase hair dryness which can increase hair loss during pregnancy.  " Highlighting" and permanents  are allowed.  Dye may be absorbed differently and permanents may not hold as well during pregnancy.  Sunbathing:  Use a sunscreen, as skin burns easily during pregnancy.  Drink plenty of fluids; avoid over heating.  Tanning Beds:  Because their possible side effects are still unknown, tanning beds are not recommended.  Ultrasound Scans:  Routine ultrasounds are performed at approximately 20 weeks.  You will be able to see your baby's general anatomy an if you would like to know the gender this can usually be determined as well.  If it is questionable when you conceived you may also receive an ultrasound early in your pregnancy for dating purposes.  Otherwise ultrasound exams are not routinely performed unless there is a medical necessity.  Although you can request a scan we ask that you pay for it when conducted because insurance does not cover " patient request" scans.  Work: If your pregnancy proceeds without complications you may work until your due date, unless your physician or employer advises otherwise.  Round Ligament Pain/Pelvic Discomfort:  Sharp, shooting pains not associated with bleeding are fairly common, usually occurring in the second trimester of pregnancy.  They tend to be worse when standing up or when you remain standing for long periods of time.  These are the result of pressure of certain pelvic ligaments called "round ligaments".  Rest, Tylenol and heat seem to be the most effective relief.  As the womb and fetus grow, they rise out of the pelvis and the discomfort improves.  Please notify the office if your pain seems different than that described.  It may represent a more serious condition.

## 2015-07-13 NOTE — Progress Notes (Signed)
Tara FinnerKristina Mccullough presents for NOB nurse interview visit. Pos upt at [facility] 06/21/2015. G-1.  P-0. Unsure of lmp- (approx)- 05/18/2015-  EDD- 02/22/2016- 8 wk. Pregnancy education material explained and given. __0_ cats in the home. NOB labs ordered. HIV labs and Drug screen were explained and ordered. PNV encouraged. NT to discuss with provider. Needs flu vaccine at 16 wks. Needs tdap at 28 weeks. Last pap 2013- wnl. No h/o abn pap. Tobacco user- at least 10 cigs qd. Trying to quit. Smoked marijuana daily prior to pos HPT. Last used 06/16/2015.  U/S for dating in 1-2 weeks. Pt. To follow up with Tara DecemberSharon  in _3_ weeks for NOB physical.  All questions answered.

## 2015-07-14 ENCOUNTER — Telehealth: Payer: Self-pay | Admitting: Certified Nurse Midwife

## 2015-07-14 ENCOUNTER — Encounter: Payer: Self-pay | Admitting: Certified Nurse Midwife

## 2015-07-14 DIAGNOSIS — Z6791 Unspecified blood type, Rh negative: Secondary | ICD-10-CM | POA: Insufficient documentation

## 2015-07-14 DIAGNOSIS — O26899 Other specified pregnancy related conditions, unspecified trimester: Secondary | ICD-10-CM

## 2015-07-14 DIAGNOSIS — O219 Vomiting of pregnancy, unspecified: Secondary | ICD-10-CM

## 2015-07-14 LAB — CBC WITH DIFFERENTIAL/PLATELET
BASOS ABS: 0 10*3/uL (ref 0.0–0.2)
Basos: 0 %
EOS (ABSOLUTE): 0.2 10*3/uL (ref 0.0–0.4)
Eos: 2 %
HEMOGLOBIN: 13.8 g/dL (ref 11.1–15.9)
Hematocrit: 40.6 % (ref 34.0–46.6)
Immature Grans (Abs): 0 10*3/uL (ref 0.0–0.1)
Immature Granulocytes: 0 %
LYMPHS ABS: 2.9 10*3/uL (ref 0.7–3.1)
Lymphs: 25 %
MCH: 31.2 pg (ref 26.6–33.0)
MCHC: 34 g/dL (ref 31.5–35.7)
MCV: 92 fL (ref 79–97)
MONOCYTES: 8 %
Monocytes Absolute: 1 10*3/uL — ABNORMAL HIGH (ref 0.1–0.9)
Neutrophils Absolute: 7.5 10*3/uL — ABNORMAL HIGH (ref 1.4–7.0)
Neutrophils: 65 %
PLATELETS: 291 10*3/uL (ref 150–379)
RBC: 4.42 x10E6/uL (ref 3.77–5.28)
RDW: 13 % (ref 12.3–15.4)
WBC: 11.7 10*3/uL — AB (ref 3.4–10.8)

## 2015-07-14 LAB — ABO AND RH: RH TYPE: NEGATIVE

## 2015-07-14 LAB — ANTIBODY SCREEN: ANTIBODY SCREEN: NEGATIVE

## 2015-07-14 LAB — URINALYSIS, ROUTINE W REFLEX MICROSCOPIC
Bilirubin, UA: NEGATIVE
Glucose, UA: NEGATIVE
Ketones, UA: NEGATIVE
Leukocytes, UA: NEGATIVE
NITRITE UA: NEGATIVE
Protein, UA: NEGATIVE
RBC, UA: NEGATIVE
Specific Gravity, UA: 1.027 (ref 1.005–1.030)
Urobilinogen, Ur: 0.2 mg/dL (ref 0.2–1.0)
pH, UA: 6.5 (ref 5.0–7.5)

## 2015-07-14 LAB — URINE CULTURE: ORGANISM ID, BACTERIA: NO GROWTH

## 2015-07-14 LAB — VARICELLA ZOSTER ANTIBODY, IGG: Varicella zoster IgG: 360 index (ref >165)

## 2015-07-14 LAB — GC/CHLAMYDIA PROBE AMP
CHLAMYDIA, DNA PROBE: NEGATIVE
NEISSERIA GONORRHOEAE BY PCR: NEGATIVE

## 2015-07-14 LAB — HIV ANTIBODY (ROUTINE TESTING W REFLEX): HIV Screen 4th Generation wRfx: NONREACTIVE

## 2015-07-14 LAB — RPR: RPR: NONREACTIVE

## 2015-07-14 LAB — RUBELLA SCREEN: RUBELLA: 2.17 {index} (ref >0.99)

## 2015-07-14 LAB — HEPATITIS B SURFACE ANTIGEN: Hepatitis B Surface Ag: NEGATIVE

## 2015-07-14 MED ORDER — PROMETHAZINE HCL 25 MG PO TABS: 25.0000 mg | ORAL_TABLET | Freq: Four times a day (QID) | ORAL | Status: DC | PRN

## 2015-07-14 NOTE — Telephone Encounter (Signed)
Pt is [redacted] wks pregnant and is very nauseated., throwing up a lot. Can she have something sent to pharmacy. [facility] [address])

## 2015-07-14 NOTE — Telephone Encounter (Signed)
I spoke with pt and she is currently on break from school and is able to take phenergan po so it was decided this would be ordered. She just got her pregnancy medicaid approved. It seems to be every other day with n/v. Pt encouraged to drink lots of fluid, eat foods such as saltine crackers, dry cereal, toast, etc. She has already tried the Vitamin B6 and did not notice that this helped. To contact office if no improvement.

## 2015-07-15 LAB — "PAIN MGT SCRN (14 DRUGS), UR                    "
Amphetamine Screen, Ur: NEGATIVE ng/mL
Barbiturate Screen, Ur: NEGATIVE ng/mL
Benzodiazepine Screen, Urine: NEGATIVE ng/mL
Buprenorphine, Urine: NEGATIVE ng/mL
Cannabinoids Ur Ql Scn: POSITIVE ng/mL
Cocaine(Metab.)Screen, Urine: NEGATIVE ng/mL
Creatinine(Crt), U: 199.8 mg/dL (ref 20.0–300.0)
Fentanyl, Urine: NEGATIVE pg/mL
Meperidine Screen, Urine: NEGATIVE ng/mL
Methadone Scn, Ur: NEGATIVE ng/mL
Opiate Scrn, Ur: NEGATIVE ng/mL
Oxycodone+Oxymorphone Ur Ql Scn: NEGATIVE ng/mL
PCP Scrn, Ur: NEGATIVE ng/mL
Ph of Urine: 5.9 (ref 4.5–8.9)
Propoxyphene, Screen: NEGATIVE ng/mL
Tramadol Ur Ql Scn: NEGATIVE ng/mL

## 2015-07-15 LAB — NICOTINE SCREEN, URINE: Cotinine Ql Scrn, Ur: POSITIVE ng/mL

## 2015-07-20 ENCOUNTER — Ambulatory Visit (INDEPENDENT_AMBULATORY_CARE_PROVIDER_SITE_OTHER): Payer: Medicaid Other

## 2015-07-20 ENCOUNTER — Ambulatory Visit (INDEPENDENT_AMBULATORY_CARE_PROVIDER_SITE_OTHER): Payer: Medicaid Other | Admitting: Certified Nurse Midwife

## 2015-07-20 ENCOUNTER — Encounter: Payer: Self-pay | Admitting: Certified Nurse Midwife

## 2015-07-20 VITALS — BP 103/67 | HR 95 | Wt 108.4 lb

## 2015-07-20 DIAGNOSIS — Z36 Encounter for antenatal screening of mother: Secondary | ICD-10-CM

## 2015-07-20 DIAGNOSIS — O021 Missed abortion: Secondary | ICD-10-CM | POA: Diagnosis not present

## 2015-07-20 DIAGNOSIS — R825 Elevated urine levels of drugs, medicaments and biological substances: Secondary | ICD-10-CM | POA: Insufficient documentation

## 2015-07-20 DIAGNOSIS — Z3687 Encounter for antenatal screening for uncertain dates: Secondary | ICD-10-CM

## 2015-07-20 NOTE — Patient Instructions (Signed)
Call if bleeding or heavy bleeding

## 2015-07-20 NOTE — Progress Notes (Signed)
Sonogram completed for viability.  Patient unsure about LMP thinks it was 05/18/15.  Patient denies cramping or spotting but states nausea has stopped.  Patient tearful about the report below  ULTRASOUND REPORT  Location: [facility] Date of Service: 07/20/15  Indications:viability Findings:  Singleton intrauterine pregnancy is visualized with a CRL consistent with 6 1/[redacted] weeks gestation, giving an (U/S) EDD of 03/05/16 . The (U/S) EDD is NOT consistent with the clinically established (LMP) EDD of 02/22/16.  FHR: not seen CRL measurement: 4.2 mm Yolk sac appears normal. Large Gestational sac consistent with 8 2/7 weeks pregnancy. This is not consistent with CRL measuring 6 1/7 weeks.   Right Ovary measures 2.7 x 1.9 x 2.2 cm. It is normal in appearance. Left Ovary measures 2.9 x 1.7 x 2.6 cm. It is normal appearance. There is evidence of a corpus luteal cyst in the Left Survey of the adnexa demonstrates no adnexal masses. There is no free peritoneal fluid in the cul de sac.  Impression: 1. 6 1/7 week Singleton Intrauterine pregnancy by U/S. 2. (U/S) EDD is NOT consistent with Clinically established (LMP) EDD of 02/22/16 . 3. Large Gestational sac consistent with 8 2/7 weeks pregnancy. This is not consistent with CRL measuring 6 1/7 weeks. 4. No fetal heart beat seen  Plan: Repeat sonogram in 1 week Will plan for Rhogam if missed ab next visit Will discuss options of D & C verses cytotec if non viable in 1 week.

## 2015-07-21 ENCOUNTER — Telehealth: Payer: Self-pay | Admitting: Certified Nurse Midwife

## 2015-07-21 NOTE — Telephone Encounter (Signed)
PT WANTS TO KNOW IF SHE CAN COME IN THIS WEEK FOR BETA HCG TO SEE IF HER LEVELS ARE UP.

## 2015-07-21 NOTE — Telephone Encounter (Signed)
Female answered phone and states could I call back in 5 min. For pt., I told him I was from the doctors office and would try to do that.

## 2015-07-23 ENCOUNTER — Other Ambulatory Visit: Payer: Self-pay | Admitting: Certified Nurse Midwife

## 2015-07-23 DIAGNOSIS — O3680X0 Pregnancy with inconclusive fetal viability, not applicable or unspecified: Secondary | ICD-10-CM

## 2015-07-23 NOTE — Telephone Encounter (Signed)
Pt notified that we do not have an earlier Bhcg level and since she is having an ultrasound on Tuesday this would not benefit at this time. Pt voiced understanding.

## 2015-07-27 ENCOUNTER — Other Ambulatory Visit: Payer: Medicaid Other

## 2015-07-27 ENCOUNTER — Ambulatory Visit (INDEPENDENT_AMBULATORY_CARE_PROVIDER_SITE_OTHER): Payer: Medicaid Other | Admitting: Obstetrics and Gynecology

## 2015-07-27 ENCOUNTER — Encounter: Payer: Medicaid Other | Admitting: Certified Nurse Midwife

## 2015-07-27 ENCOUNTER — Ambulatory Visit (INDEPENDENT_AMBULATORY_CARE_PROVIDER_SITE_OTHER): Payer: Medicaid Other

## 2015-07-27 ENCOUNTER — Encounter: Payer: Self-pay | Admitting: Obstetrics and Gynecology

## 2015-07-27 VITALS — BP 101/66 | HR 105 | Wt 109.0 lb

## 2015-07-27 DIAGNOSIS — O3680X0 Pregnancy with inconclusive fetal viability, not applicable or unspecified: Secondary | ICD-10-CM | POA: Diagnosis not present

## 2015-07-27 DIAGNOSIS — Z6791 Unspecified blood type, Rh negative: Secondary | ICD-10-CM

## 2015-07-27 DIAGNOSIS — O021 Missed abortion: Secondary | ICD-10-CM | POA: Diagnosis not present

## 2015-07-27 MED ORDER — RHO D IMMUNE GLOBULIN 1500 UNITS IM SOSY
1500.0000 [IU] | PREFILLED_SYRINGE | Freq: Once | INTRAMUSCULAR | Status: AC
  Administered 2015-07-27: 1500 [IU] via INTRAMUSCULAR

## 2015-07-27 NOTE — Progress Notes (Signed)
OB problem/operative note: Patient has embryonic demise based on ultrasound Which demonstrated no fetal cardiac activity and a 6.2 week crown-rump length fetus associated with an enlarged, irregular shaped gestational sac.  She is not experiencing any bleeding or cramping.  She is Rh- and has received RhoGAM today.  Options of management have been reviewed and the decision is made for suction D&C to be performed on 08/02/2015.  Preoperative counseling and history and physical were completed today.  A total of 15 minutes were spent face-to-face with the patient during this encounter and over half of that time dealt with counseling and coordination of care.  Herold HarmsMartin A Jeramyah Goodpasture, MD

## 2015-07-27 NOTE — H&P (Signed)
Subjective:    Patient is a 30 y.o. G1P13female scheduled for Suction D&C. Indications for procedure are First trimester Embryonic Demise. EGA [redacted] weeks. U/S x 2 No FCA; CRL 4.8 mm; Gestational sac - teardrop irregular.  Patient is Rh- and status post RhoGAM therapy on 07/27/2015  Pertinent Gynecological History: Menses: 05/18/15 Bleeding: none Contraception: NA Last mammogram: NA Last pap: unknown     Menstrual History: OB History    Gravida Para Term Preterm AB TAB SAB Ectopic Multiple Living   1               Menarche age: na  Patient's last menstrual period was 05/18/2015 (approximate).    Past Medical History  Diagnosis Date  . Ovarian cyst   . Painful menstrual periods   . Tobacco user   . Uses marijuana     last used 06/16/2015- previously used daily    Past Surgical History  Procedure Laterality Date  . Wisdom tooth extraction      OB History  Gravida Para Term Preterm AB SAB TAB Ectopic Multiple Living  1             # Outcome Date GA Lbr Len/2nd Weight Sex Delivery Anes PTL Lv  1 Current               Social History   Social History  . Marital Status: Divorced    Spouse Name: N/A  . Number of Children: N/A  . Years of Education: N/A   Social History Main Topics  . Smoking status: Current Every Day Smoker -- 0.50 packs/day    Types: Cigarettes  . Smokeless tobacco: None  . Alcohol Use: Yes     Comment: occas  . Drug Use: Yes    Special: Marijuana     Comment: 06/16/2015 last used- prior to pregnancy used MJ- qd  . Sexual Activity: Yes    Birth Control/ Protection: None   Other Topics Concern  . None   Social History Narrative    Family History  Problem Relation Age of Onset  . Heart disease Father   . Diabetes Maternal Aunt   . Heart disease Maternal Grandmother   . Diabetes Maternal Grandfather   . Heart disease Paternal Grandmother   . Cancer Neg Hx      (Not in a hospital admission)  No Known Allergies  Review of  Systems Constitutional: No recent fever/chills/sweats Respiratory: No recent cough/bronchitis Cardiovascular: No chest pain Gastrointestinal: No recent nausea/vomiting/diarrhea Genitourinary: No UTI symptoms Hematologic/lymphatic:No history of coagulopathy or recent blood thinner use    Objective:    BP 101/66 mmHg  Pulse 105  Wt 109 lb (49.442 kg)  LMP 05/18/2015 (Approximate)  General:   Normal  Skin:   normal  HEENT:  Normal  Neck:  Supple without Adenopathy or Thyromegaly  Lungs:   Heart:              Breasts:   Abdomen:  Pelvis:  M/S   Extremeties:  Neuro:    clear to auscultation bilaterally   Normal without murmur   Not Examined   soft, non-tender; bowel sounds normal; no masses,  no organomegaly   Exam deferred to OR  No CVAT  Warm/Dry   Normal          Assessment:    First trimester embryonic demise. Rh-, status post RhoGAM   Plan:  Suction D&C.  Preoperative counseling: Patient has been counseled regarding the upcoming procedure.  She is  understanding and accepting of all surgical risks which include but are not limited to bleeding, infection, pelvic organ injury with need for repair, blood disorders, anesthesia risks, etc.  All questions have been answered.  Informed consent is given.  Patient is ready and willing to proceed with surgery as scheduled. Preop testing ordered. Instructions reviewed, including NPO after midnight.

## 2015-07-29 ENCOUNTER — Encounter
Admission: RE | Admit: 2015-07-29 | Discharge: 2015-07-29 | Disposition: A | Discharge status: Home / Self Care | Payer: Medicaid Other | Source: Ambulatory Visit | Attending: Obstetrics and Gynecology | Admitting: Obstetrics and Gynecology

## 2015-07-29 DIAGNOSIS — Z01812 Encounter for preprocedural laboratory examination: Secondary | ICD-10-CM | POA: Insufficient documentation

## 2015-07-29 NOTE — Pre-Procedure Instructions (Signed)
Dr. Pricilla Holmefranseco OK'd dc of repeat blood test due to recent results being adequate.  No need for repeat rhogam the day of surgery.  She received it in the office. Will repeat Type and Screen AM of surgery so it will be within the 3 day guideline.

## 2015-07-29 NOTE — Patient Instructions (Signed)
  Your procedure is scheduled on:08/02/15 Report to Day Surgery. To find out your arrival time please call [phone] between 1PM - 3PM on today.  Remember: Instructions that are not followed completely may result in serious medical risk, up to and including death, or upon the discretion of your surgeon and anesthesiologist your surgery may need to be rescheduled.    __x__ 1. Do not eat food or drink liquids after midnight. No gum chewing or hard candies.     __x__ 2. No Alcohol for 24 hours before or after surgery.   ____ 3. Bring all medications with you on the day of surgery if instructed.    __x__ 4. Notify your doctor if there is any change in your medical condition     (cold, fever, infections).     Do not wear jewelry, make-up, hairpins, clips or nail polish.  Do not wear lotions, powders, or perfumes. You may wear deodorant.  Do not shave 48 hours prior to surgery. Men may shave face and neck.  Do not bring valuables to the hospital.    [facility] is not responsible for any belongings or valuables.               Contacts, dentures or bridgework may not be worn into surgery.  Leave your suitcase in the car. After surgery it may be brought to your room.  For patients admitted to the hospital, discharge time is determined by your                treatment team.   Patients discharged the day of surgery will not be allowed to drive home.   Please read over the following fact sheets that you were given:   Surgical Site Infection Prevention   ____ Take these medicines the morning of surgery with A SIP OF WATER:    1.   2.   3.   4.  5.  6.  ____ Fleet Enema (as directed)   ____ Use CHG Soap as directed  ____ Use inhalers on the day of surgery  ____ Stop metformin 2 days prior to surgery    ____ Take 1/2 of usual insulin dose the night before surgery and none on the morning of surgery.   ____ Stop Coumadin/Plavix/aspirin on   ____ Stop Anti-inflammatories on  today Tylenol on ly   ____ Stop supplements until after surgery.    ____ Bring C-Pap to the hospital.

## 2015-07-30 ENCOUNTER — Encounter: Payer: Medicaid Other | Admitting: Obstetrics and Gynecology

## 2015-08-02 ENCOUNTER — Encounter
Admission: RE | Disposition: A | Discharge status: Home / Self Care | Payer: Self-pay | Source: Ambulatory Visit | Attending: Obstetrics and Gynecology

## 2015-08-02 ENCOUNTER — Ambulatory Visit: Payer: Medicaid Other | Admitting: Anesthesiology

## 2015-08-02 ENCOUNTER — Encounter: Payer: Self-pay | Admitting: *Deleted

## 2015-08-02 ENCOUNTER — Ambulatory Visit
Admission: RE | Admit: 2015-08-02 | Discharge: 2015-08-02 | Disposition: A | Discharge status: Home / Self Care | Payer: Medicaid Other | Source: Ambulatory Visit | Attending: Obstetrics and Gynecology | Admitting: Obstetrics and Gynecology

## 2015-08-02 ENCOUNTER — Encounter: Payer: Medicaid Other | Admitting: Certified Nurse Midwife

## 2015-08-02 DIAGNOSIS — F17210 Nicotine dependence, cigarettes, uncomplicated: Secondary | ICD-10-CM | POA: Diagnosis not present

## 2015-08-02 DIAGNOSIS — F1290 Cannabis use, unspecified, uncomplicated: Secondary | ICD-10-CM | POA: Diagnosis not present

## 2015-08-02 DIAGNOSIS — O021 Missed abortion: Secondary | ICD-10-CM | POA: Diagnosis present

## 2015-08-02 HISTORY — PX: DILATION AND EVACUATION: SHX1459

## 2015-08-02 LAB — ABO/RH: ABO/RH(D): O NEG

## 2015-08-02 LAB — TYPE AND SCREEN
ABO/RH(D): O NEG
ANTIBODY SCREEN: POSITIVE

## 2015-08-02 SURGERY — DILATION AND EVACUATION, UTERUS
Anesthesia: General

## 2015-08-02 MED ORDER — FENTANYL CITRATE (PF) 100 MCG/2ML IJ SOLN
INTRAMUSCULAR | Status: DC | PRN
  Administered 2015-08-02 (×3): 50 ug via INTRAVENOUS

## 2015-08-02 MED ORDER — ONDANSETRON HCL 4 MG/2ML IJ SOLN
INTRAMUSCULAR | Status: DC | PRN
  Administered 2015-08-02: 4 mg via INTRAVENOUS

## 2015-08-02 MED ORDER — OXYCODONE HCL 5 MG PO TABS: 5.0000 mg | ORAL_TABLET | Freq: Once | ORAL | Status: DC | PRN

## 2015-08-02 MED ORDER — KETOROLAC TROMETHAMINE 30 MG/ML IJ SOLN
INTRAMUSCULAR | Status: DC | PRN
  Administered 2015-08-02: 30 mg via INTRAVENOUS

## 2015-08-02 MED ORDER — DEXAMETHASONE SODIUM PHOSPHATE 10 MG/ML IJ SOLN
INTRAMUSCULAR | Status: DC | PRN
  Administered 2015-08-02: 10 mg via INTRAVENOUS

## 2015-08-02 MED ORDER — OXYCODONE HCL 5 MG/5ML PO SOLN: 5.0000 mg | Freq: Once | ORAL | Status: DC | PRN

## 2015-08-02 MED ORDER — FAMOTIDINE 20 MG PO TABS
20.0000 mg | ORAL_TABLET | Freq: Once | ORAL | Status: AC
  Administered 2015-08-02: 20 mg via ORAL

## 2015-08-02 MED ORDER — OXYCODONE-ACETAMINOPHEN 5-325 MG PO TABS: 1.0000 | ORAL_TABLET | ORAL | Status: DC | PRN

## 2015-08-02 MED ORDER — FENTANYL CITRATE (PF) 100 MCG/2ML IJ SOLN
25.0000 ug | INTRAMUSCULAR | Status: DC | PRN
  Administered 2015-08-02 (×4): 25 ug via INTRAVENOUS

## 2015-08-02 MED ORDER — FAMOTIDINE 20 MG PO TABS
ORAL_TABLET | ORAL | Status: AC
  Filled 2015-08-02: qty 1

## 2015-08-02 MED ORDER — LACTATED RINGERS IV SOLN
INTRAVENOUS | Status: DC
  Administered 2015-08-02: 14:00:00 via INTRAVENOUS

## 2015-08-02 MED ORDER — FENTANYL CITRATE (PF) 100 MCG/2ML IJ SOLN
INTRAMUSCULAR | Status: AC
  Filled 2015-08-02: qty 2

## 2015-08-02 MED ORDER — ONDANSETRON HCL 4 MG/2ML IJ SOLN
INTRAMUSCULAR | Status: AC
  Administered 2015-08-02: 4 mg
  Filled 2015-08-02: qty 2

## 2015-08-02 MED ORDER — IBUPROFEN 800 MG PO TABS: 800.0000 mg | ORAL_TABLET | Freq: Three times a day (TID) | ORAL | Status: DC

## 2015-08-02 SURGICAL SUPPLY — 19 items
CATH ROBINSON RED A/P 16FR (CATHETERS) ×3 IMPLANT
DRSG TELFA 3X8 NADH (GAUZE/BANDAGES/DRESSINGS) ×3 IMPLANT
GLOVE BIO SURGEON STRL SZ8 (GLOVE) ×3 IMPLANT
GOWN STRL REUS W/ TWL LRG LVL3 (GOWN DISPOSABLE) ×1 IMPLANT
GOWN STRL REUS W/ TWL XL LVL3 (GOWN DISPOSABLE) ×1 IMPLANT
GOWN STRL REUS W/TWL LRG LVL3 (GOWN DISPOSABLE) ×2
GOWN STRL REUS W/TWL XL LVL3 (GOWN DISPOSABLE) ×2
KIT BERKELEY 1ST TRIMESTER 3/8 (MISCELLANEOUS) ×3 IMPLANT
KIT RM TURNOVER CYSTO AR (KITS) ×3 IMPLANT
PACK DNC HYST (MISCELLANEOUS) ×3 IMPLANT
PAD OB MATERNITY 4.3X12.25 (PERSONAL CARE ITEMS) ×3 IMPLANT
PAD PREP 24X41 OB/GYN DISP (PERSONAL CARE ITEMS) ×3 IMPLANT
SET BERKELEY SUCTION TUBING (SUCTIONS) ×3 IMPLANT
SOL PREP PVP 2OZ (MISCELLANEOUS) ×3
SOLUTION PREP PVP 2OZ (MISCELLANEOUS) ×1 IMPLANT
SPONGE XRAY 4X4 16PLY STRL (MISCELLANEOUS) ×3 IMPLANT
TOWEL OR 17X26 4PK STRL BLUE (TOWEL DISPOSABLE) ×3 IMPLANT
VACURETTE 10 RIGID CVD (CANNULA) ×3 IMPLANT
VACURETTE 8 RIGID CVD (CANNULA) IMPLANT

## 2015-08-02 NOTE — Transfer of Care (Signed)
Immediate Anesthesia Transfer of Care Note  Patient: Enedina FinnerKristina Bevill  Procedure(s) Performed: Procedure(s): DILATATION AND EVACUATION (N/A)  Patient Location: PACU  Anesthesia Type:General  Level of Consciousness: awake, alert  and oriented  Airway & Oxygen Therapy: Patient Spontanous Breathing and Patient connected to face mask oxygen  Post-op Assessment: Report given to RN and Post -op Vital signs reviewed and stable  Post vital signs: Reviewed and stable  Last Vitals:  Filed Vitals:   08/02/15 1332 08/02/15 1615  BP: 108/66 110/66  Pulse: 88 90  Temp:  36.9 C  Resp: 16 16    Complications: No apparent anesthesia complications

## 2015-08-02 NOTE — Anesthesia Postprocedure Evaluation (Signed)
Anesthesia Post Note  Patient: Tara Mccullough  Procedure(Mccullough) Performed: Procedure(Mccullough) (LRB): DILATATION AND EVACUATION (N/A)  Patient location during evaluation: PACU Anesthesia Type: General Level of consciousness: awake Pain management: pain level controlled Vital Signs Assessment: post-procedure vital signs reviewed and stable Respiratory status: spontaneous breathing Cardiovascular status: blood pressure returned to baseline Anesthetic complications: no    Last Vitals:  Filed Vitals:   08/02/15 1706 08/02/15 1748  BP: 115/62 110/60  Pulse: 80 83  Temp: 36.7 C   Resp: 12 14    Last Pain:  Filed Vitals:   08/02/15 1749  PainSc: 3                  Tara Mccullough

## 2015-08-02 NOTE — Anesthesia Procedure Notes (Signed)
Procedure Name: LMA Insertion Date/Time: 08/02/2015 3:42 PM Performed by: Omer JackWEATHERLY, Tiffinie Caillier Pre-anesthesia Checklist: Patient identified, Patient being monitored, Timeout performed, Emergency Drugs available and Suction available Patient Re-evaluated:Patient Re-evaluated prior to inductionOxygen Delivery Method: Circle system utilized Preoxygenation: Pre-oxygenation with 100% oxygen Intubation Type: IV induction Ventilation: Mask ventilation without difficulty LMA: LMA inserted LMA Size: 3.0 Tube type: Oral Number of attempts: 1 Placement Confirmation: positive ETCO2 and breath sounds checked- equal and bilateral Tube secured with: Tape Dental Injury: Teeth and Oropharynx as per pre-operative assessment

## 2015-08-02 NOTE — Interval H&P Note (Signed)
History and Physical Interval Note:  08/02/2015 3:15 PM  Tara FinnerKristina Simones  has presented today for surgery, with the diagnosis of EMBRYONIC DEMISE  The various methods of treatment have been discussed with the patient and family. After consideration of risks, benefits and other options for treatment, the patient has consented to  Procedure(s): DILATATION AND EVACUATION (N/A) as a surgical intervention .  The patient's history has been reviewed, patient examined, no change in status, stable for surgery.  I have reviewed the patient's chart and labs.  Questions were answered to the patient's satisfaction.     Daphine DeutscherMartin A Quirino Kakos

## 2015-08-02 NOTE — Op Note (Signed)
Tara FinnerKristina Fanara PROCEDURE DATE: 08/02/2015 3:59 PM  PREOPERATIVE DIAGNOSIS: EMBRYONIC DEMISE POSTOPERATIVE DIAGNOSIS: EMBRYONIC DEMISE PROCEDURE: Procedure(s): DILATATION AND EVACUATION (N/A) SURGEON:  Dr. Daphine DeutscherMartin A Kiannah Grunow ASSISTANT: None ANESTHESIA: General  INDICATIONS: 30 y.o. G1P0 with history ofFetal Demise at [redacted] weeks gestation presents for surgical management.   Please see preoperative notes for further details.   FINDINGS:  Uterus wasMidplane and 10 week size. Tissue consistent with products of conception was removed and sent to Pathology.   I/O's: Total I/O In: 400 [I.V.:400] Out: 225 [Urine:150; Blood:75] SPECIMENS: POC  COMPLICATIONS: None immediate COUNTS:  YES  PROCEDURE IN DETAIL: The patient was brought to the operating room where she was placed into the supine position.  General LMA anesthesia was induced without incident. She was placed in the dorsal lithotomy position using candy cane stirrups, and was examined; A Betadine prep and drape was performed in sterile fashion.   A  Red Robinson catheter was used to drain the bladder of urine. A weighted speculum was placed into  the vagina and a single tooth tenaculum was applied to the anterior lip of the cervix. The cervix was gently dilated using Hank's Dilators to accommodate a 10 mm suction curette, that was gently advanced to the uterine fundus.  The suction device was then activated and the curette was slowly rotated to clear the uterine cavity of products of conception.  A sharp curettage with a serrated curette  was then performed to confirm complete emptying of the uterus. Minimal bleeding was encountered. The tenaculum was removed along with all instruments  from the  vagina.   The patient was awakened, mobilized and taken to the recovery room in satisfactory condition.  The procedure was well-tolerated.  The patient will be discharged to home as per PACU criteria.  Routine postoperative instructions were given  along with a prescription for analgesics.  She will follow up in the clinic in 1-2 weeks for postoperative evaluation.  Herold HarmsMartin A Totiana Everson, MD [facility]

## 2015-08-02 NOTE — H&P (View-Only) (Signed)
Subjective:    Patient is a 30 y.o. G1P13female scheduled for Suction D&C. Indications for procedure are First trimester Embryonic Demise. EGA [redacted] weeks. U/S x 2 No FCA; CRL 4.8 mm; Gestational sac - teardrop irregular.  Patient is Rh- and status post RhoGAM therapy on 07/27/2015  Pertinent Gynecological History: Menses: 05/18/15 Bleeding: none Contraception: NA Last mammogram: NA Last pap: unknown     Menstrual History: OB History    Gravida Para Term Preterm AB TAB SAB Ectopic Multiple Living   1               Menarche age: na  Patient's last menstrual period was 05/18/2015 (approximate).    Past Medical History  Diagnosis Date  . Ovarian cyst   . Painful menstrual periods   . Tobacco user   . Uses marijuana     last used 06/16/2015- previously used daily    Past Surgical History  Procedure Laterality Date  . Wisdom tooth extraction      OB History  Gravida Para Term Preterm AB SAB TAB Ectopic Multiple Living  1             # Outcome Date GA Lbr Len/2nd Weight Sex Delivery Anes PTL Lv  1 Current               Social History   Social History  . Marital Status: Divorced    Spouse Name: N/A  . Number of Children: N/A  . Years of Education: N/A   Social History Main Topics  . Smoking status: Current Every Day Smoker -- 0.50 packs/day    Types: Cigarettes  . Smokeless tobacco: None  . Alcohol Use: Yes     Comment: occas  . Drug Use: Yes    Special: Marijuana     Comment: 06/16/2015 last used- prior to pregnancy used MJ- qd  . Sexual Activity: Yes    Birth Control/ Protection: None   Other Topics Concern  . None   Social History Narrative    Family History  Problem Relation Age of Onset  . Heart disease Father   . Diabetes Maternal Aunt   . Heart disease Maternal Grandmother   . Diabetes Maternal Grandfather   . Heart disease Paternal Grandmother   . Cancer Neg Hx      (Not in a hospital admission)  No Known Allergies  Review of  Systems Constitutional: No recent fever/chills/sweats Respiratory: No recent cough/bronchitis Cardiovascular: No chest pain Gastrointestinal: No recent nausea/vomiting/diarrhea Genitourinary: No UTI symptoms Hematologic/lymphatic:No history of coagulopathy or recent blood thinner use    Objective:    BP 101/66 mmHg  Pulse 105  Wt 109 lb (49.442 kg)  LMP 05/18/2015 (Approximate)  General:   Normal  Skin:   normal  HEENT:  Normal  Neck:  Supple without Adenopathy or Thyromegaly  Lungs:   Heart:              Breasts:   Abdomen:  Pelvis:  M/S   Extremeties:  Neuro:    clear to auscultation bilaterally   Normal without murmur   Not Examined   soft, non-tender; bowel sounds normal; no masses,  no organomegaly   Exam deferred to OR  No CVAT  Warm/Dry   Normal          Assessment:    First trimester embryonic demise. Rh-, status post RhoGAM   Plan:  Suction D&C.  Preoperative counseling: Patient has been counseled regarding the upcoming procedure.  She is  understanding and accepting of all surgical risks which include but are not limited to bleeding, infection, pelvic organ injury with need for repair, blood disorders, anesthesia risks, etc.  All questions have been answered.  Informed consent is given.  Patient is ready and willing to proceed with surgery as scheduled. Preop testing ordered. Instructions reviewed, including NPO after midnight.

## 2015-08-02 NOTE — Progress Notes (Signed)
Discussed hx marijuana with Dr Randa NgoPiscitello and if UDS needed, reviewed VS, advises UDS not needed

## 2015-08-02 NOTE — Anesthesia Preprocedure Evaluation (Signed)
Anesthesia Evaluation  Patient identified by MRN, date of birth, ID band Patient awake    Reviewed: Allergy & Precautions, H&P , NPO status , Patient's Chart, lab work & pertinent test results  History of Anesthesia Complications Negative for: history of anesthetic complications  Airway Mallampati: II  TM Distance: >3 FB Neck ROM: full    Dental no notable dental hx. (+) Teeth Intact   Pulmonary neg shortness of breath, Current Smoker,    Pulmonary exam normal breath sounds clear to auscultation       Cardiovascular Exercise Tolerance: Good (-) angina(-) Past MI and (-) DOE negative cardio ROS Normal cardiovascular exam Rhythm:regular Rate:Normal     Neuro/Psych  Headaches, PSYCHIATRIC DISORDERS Depression    GI/Hepatic negative GI ROS, Neg liver ROS,   Endo/Other  negative endocrine ROS  Renal/GU negative Renal ROS  negative genitourinary   Musculoskeletal   Abdominal   Peds  Hematology negative hematology ROS (+)   Anesthesia Other Findings Past Medical History:   Ovarian cyst                                                 Painful menstrual periods                                    Tobacco user                                                 Uses marijuana                                                 Comment:last used 06/16/2015- previously used daily  Past Surgical History:   WISDOM TOOTH EXTRACTION                                      BMI    Body Mass Index   19.31 kg/m 2      Reproductive/Obstetrics negative OB ROS                             Anesthesia Physical Anesthesia Plan  ASA: III  Anesthesia Plan: General LMA   Post-op Pain Management:    Induction:   Airway Management Planned:   Additional Equipment:   Intra-op Plan:   Post-operative Plan:   Informed Consent: I have reviewed the patients History and Physical, chart, labs and discussed the  procedure including the risks, benefits and alternatives for the proposed anesthesia with the patient or authorized representative who has indicated his/her understanding and acceptance.   Dental Advisory Given  Plan Discussed with: Anesthesiologist, CRNA and Surgeon  Anesthesia Plan Comments:         Anesthesia Quick Evaluation

## 2015-08-03 ENCOUNTER — Encounter: Payer: Self-pay | Admitting: Obstetrics and Gynecology

## 2015-08-03 ENCOUNTER — Telehealth: Payer: Self-pay | Admitting: Obstetrics and Gynecology

## 2015-08-03 NOTE — Telephone Encounter (Signed)
PT CALLED AND SHE HAD A D&C ON Monday AND SHE HAD SOME QUESTIONS ABOUT THE HOME CARE AND WOULD LIKE A CALL BACK.

## 2015-08-03 NOTE — Telephone Encounter (Signed)
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 Had d&c yesterday. NO bleeding or cramping. NO pain meds needed. Pt wanted to know how long she should stand at school. Advised no more than 4 hours at a time. She should listen to her body and modify if needed. Pt voices understanding.

## 2015-08-04 ENCOUNTER — Ambulatory Visit: Payer: Medicaid Other | Admitting: Obstetrics and Gynecology

## 2015-08-04 LAB — SURGICAL PATHOLOGY

## 2015-08-05 DIAGNOSIS — O021 Missed abortion: Secondary | ICD-10-CM

## 2015-08-12 ENCOUNTER — Encounter: Payer: Self-pay | Admitting: Obstetrics and Gynecology

## 2015-08-12 ENCOUNTER — Ambulatory Visit: Payer: Medicaid Other | Admitting: Obstetrics and Gynecology

## 2015-08-12 ENCOUNTER — Ambulatory Visit (INDEPENDENT_AMBULATORY_CARE_PROVIDER_SITE_OTHER): Payer: Medicaid Other | Admitting: Obstetrics and Gynecology

## 2015-08-12 VITALS — BP 103/72 | HR 102 | Ht 63.0 in | Wt 110.0 lb

## 2015-08-12 DIAGNOSIS — O021 Missed abortion: Secondary | ICD-10-CM

## 2015-08-12 DIAGNOSIS — Z09 Encounter for follow-up examination after completed treatment for conditions other than malignant neoplasm: Secondary | ICD-10-CM

## 2015-08-12 MED ORDER — MEDROXYPROGESTERONE ACETATE 150 MG/ML IM SUSP: 150.0000 mg | INTRAMUSCULAR | Status: DC

## 2015-08-12 NOTE — Progress Notes (Cosign Needed)
GYN ENCOUNTER NOTE  Subjective:       Tara Mccullough is a 30 y.o. G1P0 female is here for gynecologic evaluation of the following issues:  1. 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 With pain and cramps x4 days after procedure. Took Ibuprofen for the pain with good relief. Continues to experience spotting and discharge.   Pathology: Decidual with chorionic villi demonstrating hydropic changes   Gynecologic History No LMP recorded (lmp unknown). Contraception: Restarting  Depo-Provera injections Last Pap: N/A Last mammogram: N/A  Obstetric History OB History  Gravida Para Term Preterm AB SAB TAB Ectopic Multiple Living  1             # Outcome Date GA Lbr Len/2nd Weight Sex Delivery Anes PTL Lv  1 Gravida               Past Medical History  Diagnosis Date  . Ovarian cyst   . Painful menstrual periods   . Tobacco user   . Uses marijuana     last used 06/16/2015- previously used daily    Past Surgical History  Procedure Laterality Date  . Wisdom tooth extraction    . Dilation and evacuation N/A 08/02/2015    Procedure: DILATATION AND EVACUATION;  Surgeon: Herold Harms, MD;  Location: [facility] ORS;  Service: Gynecology;  Laterality: N/A;    Current Outpatient Prescriptions on File Prior to Visit  Medication Sig Dispense Refill  . ibuprofen (ADVIL,MOTRIN) 800 MG tablet Take 1 tablet (800 mg total) by mouth 3 (three) times daily. 30 tablet 1   No current facility-administered medications on file prior to visit.    No Known Allergies  Social History   Social History  . Marital Status: Divorced    Spouse Name: N/A  . Number of Children: N/A  . Years of Education: N/A   Occupational History  . Not on file.   Social History Main Topics  . Smoking status: Current Every Day Smoker -- 0.50 packs/day    Types: Cigarettes  . Smokeless tobacco: Not on file  . Alcohol Use: Yes     Comment: occas  . Drug Use: Yes    Special:  Marijuana     Comment: 06/16/2015 last used- prior to pregnancy used MJ- qd  . Sexual Activity: Yes    Birth Control/ Protection: None   Other Topics Concern  . Not on file   Social History Narrative    Family History  Problem Relation Age of Onset  . Heart disease Father   . Diabetes Maternal Aunt   . Heart disease Maternal Grandmother   . Diabetes Maternal Grandfather   . Heart disease Paternal Grandmother   . Cancer Neg Hx     The following portions of the patient's history were reviewed and updated as appropriate: allergies, current medications, past family history, past medical history, past social history, past surgical history and problem list.  Review of Systems See above   Objective:   BP 103/72 mmHg  Pulse 102  Ht  (1.6 m)  Wt 110 lb (49.896 kg)  BMI 19.49 kg/m2  LMP  (LMP Unknown)  Breastfeeding? Unknown - Physical exam deferred      Assessment:   1. Embryonic demise  2. Missed abortion  3. Postop check, normal  4.  Rh-, status post RhoGAM therapy     Plan:   1. Resume all activities except for intercourse for another 1-2 weeks. 2. Restart Depo-Provera  for contraception. Prescription called into pharmacy. 3. Schedule annual exam in 2 months   Amy Cathie Hoops, PA-S Herold Harms, MD   I have seen, interviewed, and examined the patient in conjunction with the [facility].A. student and affirm the diagnosis and management plan. Martin A. DeFrancesco, MD, FACOG   Note: This dictation was prepared with Dragon dictation along with smaller phrase technology. Any transcriptional errors that result from this process are unintentional.

## 2015-08-12 NOTE — Patient Instructions (Signed)
1.  Resume all activities except for intercourse for another 1-2 weeks. 2.  Depo-Provera is called into her pharmacy.  Please call our office at your convenience.  After medication is received to come in for injection. 3.  Annual examination will be scheduled for you in 2 months.

## 2015-08-16 ENCOUNTER — Ambulatory Visit (INDEPENDENT_AMBULATORY_CARE_PROVIDER_SITE_OTHER): Payer: Medicaid Other | Admitting: Obstetrics and Gynecology

## 2015-08-16 VITALS — BP 119/78 | HR 102 | Wt 109.4 lb

## 2015-08-16 DIAGNOSIS — Z3042 Encounter for surveillance of injectable contraceptive: Secondary | ICD-10-CM | POA: Diagnosis not present

## 2015-08-16 LAB — POCT URINE PREGNANCY: Preg Test, Ur: NEGATIVE

## 2015-08-16 MED ORDER — MEDROXYPROGESTERONE ACETATE 150 MG/ML IM SUSP
150.0000 mg | Freq: Once | INTRAMUSCULAR | Status: AC
  Administered 2015-08-16: 150 mg via INTRAMUSCULAR

## 2015-08-16 NOTE — Progress Notes (Signed)
Patient ID: Tara Mccullough, female   DOB: 1986/01/22, 30 y.o.   MRN: [id number] Pt presents for 1st Depo-Provera injection. Had recent miscarriage. Pt states she has been on this before, maybe 3 yrs ago. Pt aware to use back up protection x1 month.

## 2015-11-01 ENCOUNTER — Ambulatory Visit (INDEPENDENT_AMBULATORY_CARE_PROVIDER_SITE_OTHER): Payer: Medicaid Other | Admitting: Obstetrics and Gynecology

## 2015-11-01 VITALS — BP 122/72 | HR 111 | Ht 63.0 in | Wt 112.9 lb

## 2015-11-01 DIAGNOSIS — Z3042 Encounter for surveillance of injectable contraceptive: Secondary | ICD-10-CM | POA: Diagnosis not present

## 2015-11-01 MED ORDER — MEDROXYPROGESTERONE ACETATE 150 MG/ML IM SUSP
150.0000 mg | Freq: Once | INTRAMUSCULAR | Status: AC
  Administered 2015-11-01: 150 mg via INTRAMUSCULAR

## 2015-11-01 NOTE — Progress Notes (Signed)
Date last pap: N/A Last Depo-Provera: 08/16/2015 Side Effects if any: Increased migraines. Advised pt to keep headache calender, and f/u with PCP. Will re acess at next visit.   Serum HCG indicated? No, pt within allotted window.  Depo-Provera 150 mg IM given by: Dorita Fray. Betzaida Cremeens, CMA Next appointment due July 3-17 Pt tolerated injection well, 1mL given in RT buttocks.

## 2015-11-01 NOTE — Patient Instructions (Signed)
Follow up in 3 months for next injection.

## 2016-01-20 ENCOUNTER — Encounter: Payer: Self-pay | Admitting: Obstetrics and Gynecology

## 2016-01-20 ENCOUNTER — Ambulatory Visit (INDEPENDENT_AMBULATORY_CARE_PROVIDER_SITE_OTHER): Payer: Medicaid Other | Admitting: Obstetrics and Gynecology

## 2016-01-20 VITALS — BP 108/75 | HR 109 | Ht 63.0 in | Wt 119.4 lb

## 2016-01-20 DIAGNOSIS — F17210 Nicotine dependence, cigarettes, uncomplicated: Secondary | ICD-10-CM

## 2016-01-20 DIAGNOSIS — Z720 Tobacco use: Secondary | ICD-10-CM

## 2016-01-20 DIAGNOSIS — Z3042 Encounter for surveillance of injectable contraceptive: Secondary | ICD-10-CM

## 2016-01-20 DIAGNOSIS — N946 Dysmenorrhea, unspecified: Secondary | ICD-10-CM | POA: Insufficient documentation

## 2016-01-20 MED ORDER — ETONOGESTREL-ETHINYL ESTRADIOL 0.12-0.015 MG/24HR VA RING: VAGINAL_RING | VAGINAL | Status: DC

## 2016-01-20 NOTE — Progress Notes (Signed)
Subjective:     Patient ID: Tara Mccullough, female   DOB: Sep 21, 1985, 30 y.o.   MRN: [id number]  HPI Had SAB in January and started on Depo at that time, desires switching due to weight gain, but has h/o very painful menses and menstrual migraines, with worsening depression around menses.  Used OCPs and Nuvaring in the past. Is due for next depo tomorrow  Review of Systems See above    Objective:   Physical Exam A&O x4 Well groomed female in no distress Blood pressure 108/75, pulse 109, height 5\' 3"  (1.6 m), weight 119 lb 6.4 oz (54.159 kg). Pelvic exam not indicated    Assessment:     Contraception counseling     Plan:     Will restart Nuvaring- and do continious cycling.  RTC 6 weeks for AE and med check.  Damaria Vachon [place], CNM

## 2016-01-21 ENCOUNTER — Ambulatory Visit: Payer: Medicaid Other

## 2016-01-23 ENCOUNTER — Encounter: Payer: Self-pay | Admitting: Obstetrics and Gynecology

## 2016-02-02 ENCOUNTER — Other Ambulatory Visit: Payer: Self-pay | Admitting: Obstetrics and Gynecology

## 2016-02-02 MED ORDER — LEVONORGESTREL-ETHINYL ESTRAD 90-20 MCG PO TABS: 1.0000 | ORAL_TABLET | Freq: Every day | ORAL | Status: DC

## 2016-03-02 ENCOUNTER — Other Ambulatory Visit: Payer: Self-pay | Admitting: Obstetrics and Gynecology

## 2016-03-02 ENCOUNTER — Encounter: Payer: Self-pay | Admitting: Obstetrics and Gynecology

## 2016-03-02 ENCOUNTER — Ambulatory Visit (INDEPENDENT_AMBULATORY_CARE_PROVIDER_SITE_OTHER): Payer: Medicaid Other | Admitting: Obstetrics and Gynecology

## 2016-03-02 VITALS — BP 110/79 | HR 100 | Ht 63.0 in | Wt 122.2 lb

## 2016-03-02 DIAGNOSIS — Z01419 Encounter for gynecological examination (general) (routine) without abnormal findings: Secondary | ICD-10-CM

## 2016-03-02 DIAGNOSIS — Z0000 Encounter for general adult medical examination without abnormal findings: Secondary | ICD-10-CM | POA: Diagnosis not present

## 2016-03-02 MED ORDER — LEVONORGESTREL-ETHINYL ESTRAD 90-20 MCG PO TABS: 1.0000 | ORAL_TABLET | Freq: Every day | ORAL | 11 refills | Status: DC

## 2016-03-02 NOTE — Progress Notes (Signed)
   Subjective:     Tara FinnerKristina Mccullough is a 30 y.o. female and is here for a comprehensive physical exam. The patient reports problems - vaginal irritation with sex since miscarriage.  Social History   Social History  . Marital status: Divorced    Spouse name: N/A  . Number of children: N/A  . Years of education: N/A   Occupational History  . Not on file.   Social History Main Topics  . Smoking status: Current Every Day Smoker    Packs/day: 0.50    Types: Cigarettes  . Smokeless tobacco: Never Used  . Alcohol use Yes     Comment: occas  . Drug use:     Types: Marijuana     Comment: 06/16/2015 last used- prior to pregnancy used MJ- qd  . Sexual activity: Yes    Birth control/ protection: None, Pill   Other Topics Concern  . Not on file   Social History Narrative  . No narrative on file   Health Maintenance  Topic Date Due  . TETANUS/TDAP  10/31/2006  . PAP SMEAR  10/30/2008  . INFLUENZA VACCINE  02/15/2016  . HIV Screening  Completed    The following portions of the patient's history were reviewed and updated as appropriate: allergies, current medications, past family history, past medical history, past social history, past surgical history and problem list.  Review of Systems Pertinent items noted in HPI and remainder of comprehensive ROS otherwise negative.   Objective:    General appearance: alert, cooperative and appears stated age Neck: no adenopathy, no carotid bruit, no JVD, supple, symmetrical, trachea midline and thyroid not enlarged, symmetric, no tenderness/mass/nodules Lungs: clear to auscultation bilaterally Breasts: normal appearance, no masses or tenderness Heart: regular rate and rhythm, S1, S2 normal, no murmur, click, rub or gallop Abdomen: soft, non-tender; bowel sounds normal; no masses,  no organomegaly Pelvic: cervix normal in appearance, external genitalia normal, no adnexal masses or tenderness, no cervical motion tenderness, rectovaginal  septum normal, uterus normal size, shape, and consistency and vagina normal without discharge    Assessment:    Healthy female exam. OCP user Vaginal irritation     Plan:  Discussed lubricants for sex, and desires to continue current OCPs as they are working well. RTC 1 year   See After Visit Summary for Counseling Recommendations

## 2016-03-02 NOTE — Patient Instructions (Signed)
Preventive Care for Adults, Female A healthy lifestyle and preventive care can promote health and wellness. Preventive health guidelines for women include the following key practices.  A routine yearly physical is a good way to check with your health care provider about your health and preventive screening. It is a chance to share any concerns and updates on your health and to receive a thorough exam.  Visit your dentist for a routine exam and preventive care every 6 months. Brush your teeth twice a day and floss once a day. Good oral hygiene prevents tooth decay and gum disease.  The frequency of eye exams is based on your age, health, family medical history, use of contact lenses, and other factors. Follow your health care provider's recommendations for frequency of eye exams.  Eat a healthy diet. Foods like vegetables, fruits, whole grains, low-fat dairy products, and lean protein foods contain the nutrients you need without too many calories. Decrease your intake of foods high in solid fats, added sugars, and salt. Eat the right amount of calories for you.Get information about a proper diet from your health care provider, if necessary.  Regular physical exercise is one of the most important things you can do for your health. Most adults should get at least 150 minutes of moderate-intensity exercise (any activity that increases your heart rate and causes you to sweat) each week. In addition, most adults need muscle-strengthening exercises on 2 or more days a week.  Maintain a healthy weight. The body mass index (BMI) is a screening tool to identify possible weight problems. It provides an estimate of body fat based on height and weight. Your health care provider can find your BMI and can help you achieve or maintain a healthy weight.For adults 20 years and older:  A BMI below 18.5 is considered underweight.  A BMI of 18.5 to 24.9 is normal.  A BMI of 25 to 29.9 is considered  overweight.  A BMI of 30 and above is considered obese.  Maintain normal blood lipids and cholesterol levels by exercising and minimizing your intake of saturated fat. Eat a balanced diet with plenty of fruit and vegetables. Blood tests for lipids and cholesterol should begin at age 64 and be repeated every 5 years. If your lipid or cholesterol levels are high, you are over 50, or you are at high risk for heart disease, you may need your cholesterol levels checked more frequently.Ongoing high lipid and cholesterol levels should be treated with medicines if diet and exercise are not working.  If you smoke, find out from your health care provider how to quit. If you do not use tobacco, do not start.  Lung cancer screening is recommended for adults aged 52-80 years who are at high risk for developing lung cancer because of a history of smoking. A yearly low-dose CT scan of the lungs is recommended for people who have at least a 30-pack-year history of smoking and are a current smoker or have quit within the past 15 years. A pack year of smoking is smoking an average of 1 pack of cigarettes a day for 1 year (for example: 1 pack a day for 30 years or 2 packs a day for 15 years). Yearly screening should continue until the smoker has stopped smoking for at least 15 years. Yearly screening should be stopped for people who develop a health problem that would prevent them from having lung cancer treatment.  If you are pregnant, do not drink alcohol. If you are  breastfeeding, be very cautious about drinking alcohol. If you are not pregnant and choose to drink alcohol, do not have more than 1 drink per day. One drink is considered to be 12 ounces (355 mL) of beer, 5 ounces (148 mL) of wine, or 1.5 ounces (44 mL) of liquor.  Avoid use of street drugs. Do not share needles with anyone. Ask for help if you need support or instructions about stopping the use of drugs.  High blood pressure causes heart disease and  increases the risk of stroke. Your blood pressure should be checked at least every 1 to 2 years. Ongoing high blood pressure should be treated with medicines if weight loss and exercise do not work.  If you are 25-78 years old, ask your health care provider if you should take aspirin to prevent strokes.  Diabetes screening is done by taking a blood sample to check your blood glucose level after you have not eaten for a certain period of time (fasting). If you are not overweight and you do not have risk factors for diabetes, you should be screened once every 3 years starting at age 86. If you are overweight or obese and you are 3-87 years of age, you should be screened for diabetes every year as part of your cardiovascular risk assessment.  Breast cancer screening is essential preventive care for women. You should practice "breast self-awareness." This means understanding the normal appearance and feel of your breasts and may include breast self-examination. Any changes detected, no matter how small, should be reported to a health care provider. Women in their 66s and 30s should have a clinical breast exam (CBE) by a health care provider as part of a regular health exam every 1 to 3 years. After age 43, women should have a CBE every year. Starting at age 37, women should consider having a mammogram (breast X-ray test) every year. Women who have a family history of breast cancer should talk to their health care provider about genetic screening. Women at a high risk of breast cancer should talk to their health care providers about having an MRI and a mammogram every year.  Breast cancer gene (BRCA)-related cancer risk assessment is recommended for women who have family members with BRCA-related cancers. BRCA-related cancers include breast, ovarian, tubal, and peritoneal cancers. Having family members with these cancers may be associated with an increased risk for harmful changes (mutations) in the breast  cancer genes BRCA1 and BRCA2. Results of the assessment will determine the need for genetic counseling and BRCA1 and BRCA2 testing.  Your health care provider may recommend that you be screened regularly for cancer of the pelvic organs (ovaries, uterus, and vagina). This screening involves a pelvic examination, including checking for microscopic changes to the surface of your cervix (Pap test). You may be encouraged to have this screening done every 3 years, beginning at age 78.  For women ages 79-65, health care providers may recommend pelvic exams and Pap testing every 3 years, or they may recommend the Pap and pelvic exam, combined with testing for human papilloma virus (HPV), every 5 years. Some types of HPV increase your risk of cervical cancer. Testing for HPV may also be done on women of any age with unclear Pap test results.  Other health care providers may not recommend any screening for nonpregnant women who are considered low risk for pelvic cancer and who do not have symptoms. Ask your health care provider if a screening pelvic exam is right for  you.  If you have had past treatment for cervical cancer or a condition that could lead to cancer, you need Pap tests and screening for cancer for at least 20 years after your treatment. If Pap tests have been discontinued, your risk factors (such as having a new sexual partner) need to be reassessed to determine if screening should resume. Some women have medical problems that increase the chance of getting cervical cancer. In these cases, your health care provider may recommend more frequent screening and Pap tests.  Colorectal cancer can be detected and often prevented. Most routine colorectal cancer screening begins at the age of 50 years and continues through age 75 years. However, your health care provider may recommend screening at an earlier age if you have risk factors for colon cancer. On a yearly basis, your health care provider may provide  home test kits to check for hidden blood in the stool. Use of a small camera at the end of a tube, to directly examine the colon (sigmoidoscopy or colonoscopy), can detect the earliest forms of colorectal cancer. Talk to your health care provider about this at age 50, when routine screening begins. Direct exam of the colon should be repeated every 5-10 years through age 75 years, unless early forms of precancerous polyps or small growths are found.  People who are at an increased risk for hepatitis B should be screened for this virus. You are considered at high risk for hepatitis B if:  You were born in a country where hepatitis B occurs often. Talk with your health care provider about which countries are considered high risk.  Your parents were born in a high-risk country and you have not received a shot to protect against hepatitis B (hepatitis B vaccine).  You have HIV or AIDS.  You use needles to inject street drugs.  You live with, or have sex with, someone who has hepatitis B.  You get hemodialysis treatment.  You take certain medicines for conditions like cancer, organ transplantation, and autoimmune conditions.  Hepatitis C blood testing is recommended for all people born from 1945 through 1965 and any individual with known risks for hepatitis C.  Practice safe sex. Use condoms and avoid high-risk sexual practices to reduce the spread of sexually transmitted infections (STIs). STIs include gonorrhea, chlamydia, syphilis, trichomonas, herpes, HPV, and human immunodeficiency virus (HIV). Herpes, HIV, and HPV are viral illnesses that have no cure. They can result in disability, cancer, and death.  You should be screened for sexually transmitted illnesses (STIs) including gonorrhea and chlamydia if:  You are sexually active and are younger than 24 years.  You are older than 24 years and your health care provider tells you that you are at risk for this type of infection.  Your sexual  activity has changed since you were last screened and you are at an increased risk for chlamydia or gonorrhea. Ask your health care provider if you are at risk.  If you are at risk of being infected with HIV, it is recommended that you take a prescription medicine daily to prevent HIV infection. This is called preexposure prophylaxis (PrEP). You are considered at risk if:  You are sexually active and do not regularly use condoms or know the HIV status of your partner(s).  You take drugs by injection.  You are sexually active with a partner who has HIV.  Talk with your health care provider about whether you are at high risk of being infected with HIV. If   you choose to begin PrEP, you should first be tested for HIV. You should then be tested every 3 months for as long as you are taking PrEP.  Osteoporosis is a disease in which the bones lose minerals and strength with aging. This can result in serious bone fractures or breaks. The risk of osteoporosis can be identified using a bone density scan. Women ages 1 years and over and women at risk for fractures or osteoporosis should discuss screening with their health care providers. Ask your health care provider whether you should take a calcium supplement or vitamin D to reduce the rate of osteoporosis.  Menopause can be associated with physical symptoms and risks. Hormone replacement therapy is available to decrease symptoms and risks. You should talk to your health care provider about whether hormone replacement therapy is right for you.  Use sunscreen. Apply sunscreen liberally and repeatedly throughout the day. You should seek shade when your shadow is shorter than you. Protect yourself by wearing long sleeves, pants, a wide-brimmed hat, and sunglasses year round, whenever you are outdoors.  Once a month, do a whole body skin exam, using a mirror to look at the skin on your back. Tell your health care provider of new moles, moles that have irregular  borders, moles that are larger than a pencil eraser, or moles that have changed in shape or color.  Stay current with required vaccines (immunizations).  Influenza vaccine. All adults should be immunized every year.  Tetanus, diphtheria, and acellular pertussis (Td, Tdap) vaccine. Pregnant women should receive 1 dose of Tdap vaccine during each pregnancy. The dose should be obtained regardless of the length of time since the last dose. Immunization is preferred during the 27th-36th week of gestation. An adult who has not previously received Tdap or who does not know her vaccine status should receive 1 dose of Tdap. This initial dose should be followed by tetanus and diphtheria toxoids (Td) booster doses every 10 years. Adults with an unknown or incomplete history of completing a 3-dose immunization series with Td-containing vaccines should begin or complete a primary immunization series including a Tdap dose. Adults should receive a Td booster every 10 years.  Varicella vaccine. An adult without evidence of immunity to varicella should receive 2 doses or a second dose if she has previously received 1 dose. Pregnant females who do not have evidence of immunity should receive the first dose after pregnancy. This first dose should be obtained before leaving the health care facility. The second dose should be obtained 4-8 weeks after the first dose.  Human papillomavirus (HPV) vaccine. Females aged 13-26 years who have not received the vaccine previously should obtain the 3-dose series. The vaccine is not recommended for use in pregnant females. However, pregnancy testing is not needed before receiving a dose. If a female is found to be pregnant after receiving a dose, no treatment is needed. In that case, the remaining doses should be delayed until after the pregnancy. Immunization is recommended for any person with an immunocompromised condition through the age of 24 years if she did not get any or all doses  earlier. During the 3-dose series, the second dose should be obtained 4-8 weeks after the first dose. The third dose should be obtained 24 weeks after the first dose and 16 weeks after the second dose.  Zoster vaccine. One dose is recommended for adults aged 97 years or older unless certain conditions are present.  Measles, mumps, and rubella (MMR) vaccine. Adults born  before 1957 generally are considered immune to measles and mumps. Adults born in [birth year] or later should have 1 or more doses of MMR vaccine unless there is a contraindication to the vaccine or there is laboratory evidence of immunity to each of the three diseases. A routine second dose of MMR vaccine should be obtained at least 28 days after the first dose for students attending postsecondary schools, health care workers, or international travelers. People who received inactivated measles vaccine or an unknown type of measles vaccine during 1963-1967 should receive 2 doses of MMR vaccine. People who received inactivated mumps vaccine or an unknown type of mumps vaccine before 1979 and are at high risk for mumps infection should consider immunization with 2 doses of MMR vaccine. For females of childbearing age, rubella immunity should be determined. If there is no evidence of immunity, females who are not pregnant should be vaccinated. If there is no evidence of immunity, females who are pregnant should delay immunization until after pregnancy. Unvaccinated health care workers born before 60 who lack laboratory evidence of measles, mumps, or rubella immunity or laboratory confirmation of disease should consider measles and mumps immunization with 2 doses of MMR vaccine or rubella immunization with 1 dose of MMR vaccine.  Pneumococcal 13-valent conjugate (PCV13) vaccine. When indicated, a person who is uncertain of his immunization history and has no record of immunization should receive the PCV13 vaccine. All adults 61 years of age and older  should receive this vaccine. An adult aged 92 years or older who has certain medical conditions and has not been previously immunized should receive 1 dose of PCV13 vaccine. This PCV13 should be followed with a dose of pneumococcal polysaccharide (PPSV23) vaccine. Adults who are at high risk for pneumococcal disease should obtain the PPSV23 vaccine at least 8 weeks after the dose of PCV13 vaccine. Adults older than 28 years of age who have normal immune system function should obtain the PPSV23 vaccine dose at least 1 year after the dose of PCV13 vaccine.  Pneumococcal polysaccharide (PPSV23) vaccine. When PCV13 is also indicated, PCV13 should be obtained first. All adults aged 2 years and older should be immunized. An adult younger than age 30 years who has certain medical conditions should be immunized. Any person who resides in a nursing home or long-term care facility should be immunized. An adult smoker should be immunized. People with an immunocompromised condition and certain other conditions should receive both PCV13 and PPSV23 vaccines. People with human immunodeficiency virus (HIV) infection should be immunized as soon as possible after diagnosis. Immunization during chemotherapy or radiation therapy should be avoided. Routine use of PPSV23 vaccine is not recommended for American Indians, [place] Natives, or people younger than 65 years unless there are medical conditions that require PPSV23 vaccine. When indicated, people who have unknown immunization and have no record of immunization should receive PPSV23 vaccine. One-time revaccination 5 years after the first dose of PPSV23 is recommended for people aged 19-64 years who have chronic kidney failure, nephrotic syndrome, asplenia, or immunocompromised conditions. People who received 1-2 doses of PPSV23 before age 44 years should receive another dose of PPSV23 vaccine at age 83 years or later if at least 5 years have passed since the previous dose. Doses  of PPSV23 are not needed for people immunized with PPSV23 at or after age 20 years.  Meningococcal vaccine. Adults with asplenia or persistent complement component deficiencies should receive 2 doses of quadrivalent meningococcal conjugate (MenACWY-D) vaccine. The doses should be obtained  at least 2 months apart. Microbiologists working with certain meningococcal bacteria, [place] recruits, people at risk during an outbreak, and people who travel to or live in countries with a high rate of meningitis should be immunized. A first-year college student up through age 28 years who is living in a residence hall should receive a dose if she did not receive a dose on or after her 16th birthday. Adults who have certain high-risk conditions should receive one or more doses of vaccine.  Hepatitis A vaccine. Adults who wish to be protected from this disease, have certain high-risk conditions, work with hepatitis A-infected animals, work in hepatitis A research labs, or travel to or work in countries with a high rate of hepatitis A should be immunized. Adults who were previously unvaccinated and who anticipate close contact with an international adoptee during the first 60 days after arrival in the [place] from a country with a high rate of hepatitis A should be immunized.  Hepatitis B vaccine. Adults who wish to be protected from this disease, have certain high-risk conditions, may be exposed to blood or other infectious body fluids, are household contacts or sex partners of hepatitis B positive people, are clients or workers in certain care facilities, or travel to or work in countries with a high rate of hepatitis B should be immunized.  Haemophilus influenzae type b (Hib) vaccine. A previously unvaccinated person with asplenia or sickle cell disease or having a scheduled splenectomy should receive 1 dose of Hib vaccine. Regardless of previous immunization, a recipient of a hematopoietic stem cell transplant  should receive a 3-dose series 6-12 months after her successful transplant. Hib vaccine is not recommended for adults with HIV infection. Preventive Services / Frequency Ages 71 to 87 years  Blood pressure check.** / Every 3-5 years.  Lipid and cholesterol check.** / Every 5 years beginning at age 1.  Clinical breast exam.** / Every 3 years for women in their 3s and 31s.  BRCA-related cancer risk assessment.** / For women who have family members with a BRCA-related cancer (breast, ovarian, tubal, or peritoneal cancers).  Pap test.** / Every 2 years from ages 50 through 86. Every 3 years starting at age 87 through age 7 or 75 with a history of 3 consecutive normal Pap tests.  HPV screening.** / Every 3 years from ages 59 through ages 35 to 6 with a history of 3 consecutive normal Pap tests.  Hepatitis C blood test.** / For any individual with known risks for hepatitis C.  Skin self-exam. / Monthly.  Influenza vaccine. / Every year.  Tetanus, diphtheria, and acellular pertussis (Tdap, Td) vaccine.** / Consult your health care provider. Pregnant women should receive 1 dose of Tdap vaccine during each pregnancy. 1 dose of Td every 10 years.  Varicella vaccine.** / Consult your health care provider. Pregnant females who do not have evidence of immunity should receive the first dose after pregnancy.  HPV vaccine. / 3 doses over 6 months, if 72 and younger. The vaccine is not recommended for use in pregnant females. However, pregnancy testing is not needed before receiving a dose.  Measles, mumps, rubella (MMR) vaccine.** / You need at least 1 dose of MMR if you were born in 1957 or later. You may also need a 2nd dose. For females of childbearing age, rubella immunity should be determined. If there is no evidence of immunity, females who are not pregnant should be vaccinated. If there is no evidence of immunity, females who are  pregnant should delay immunization until after  pregnancy.  Pneumococcal 13-valent conjugate (PCV13) vaccine.** / Consult your health care provider.  Pneumococcal polysaccharide (PPSV23) vaccine.** / 1 to 2 doses if you smoke cigarettes or if you have certain conditions.  Meningococcal vaccine.** / 1 dose if you are age 87 to 44 years and a Market researcher living in a residence hall, or have one of several medical conditions, you need to get vaccinated against meningococcal disease. You may also need additional booster doses.  Hepatitis A vaccine.** / Consult your health care provider.  Hepatitis B vaccine.** / Consult your health care provider.  Haemophilus influenzae type b (Hib) vaccine.** / Consult your health care provider. Ages 86 to 38 years  Blood pressure check.** / Every year.  Lipid and cholesterol check.** / Every 5 years beginning at age 49 years.  Lung cancer screening. / Every year if you are aged 71-80 years and have a 30-pack-year history of smoking and currently smoke or have quit within the past 15 years. Yearly screening is stopped once you have quit smoking for at least 15 years or develop a health problem that would prevent you from having lung cancer treatment.  Clinical breast exam.** / Every year after age 51 years.  BRCA-related cancer risk assessment.** / For women who have family members with a BRCA-related cancer (breast, ovarian, tubal, or peritoneal cancers).  Mammogram.** / Every year beginning at age 18 years and continuing for as long as you are in good health. Consult with your health care provider.  Pap test.** / Every 3 years starting at age 63 years through age 37 or 57 years with a history of 3 consecutive normal Pap tests.  HPV screening.** / Every 3 years from ages 41 years through ages 76 to 23 years with a history of 3 consecutive normal Pap tests.  Fecal occult blood test (FOBT) of stool. / Every year beginning at age 36 years and continuing until age 51 years. You may not need  to do this test if you get a colonoscopy every 10 years.  Flexible sigmoidoscopy or colonoscopy.** / Every 5 years for a flexible sigmoidoscopy or every 10 years for a colonoscopy beginning at age 36 years and continuing until age 35 years.  Hepatitis C blood test.** / For all people born from [birth year] through 1963 and any individual with known risks for hepatitis C.  Skin self-exam. / Monthly.  Influenza vaccine. / Every year.  Tetanus, diphtheria, and acellular pertussis (Tdap/Td) vaccine.** / Consult your health care provider. Pregnant women should receive 1 dose of Tdap vaccine during each pregnancy. 1 dose of Td every 10 years.  Varicella vaccine.** / Consult your health care provider. Pregnant females who do not have evidence of immunity should receive the first dose after pregnancy.  Zoster vaccine.** / 1 dose for adults aged 73 years or older.  Measles, mumps, rubella (MMR) vaccine.** / You need at least 1 dose of MMR if you were born in 1957 or later. You may also need a second dose. For females of childbearing age, rubella immunity should be determined. If there is no evidence of immunity, females who are not pregnant should be vaccinated. If there is no evidence of immunity, females who are pregnant should delay immunization until after pregnancy.  Pneumococcal 13-valent conjugate (PCV13) vaccine.** / Consult your health care provider.  Pneumococcal polysaccharide (PPSV23) vaccine.** / 1 to 2 doses if you smoke cigarettes or if you have certain conditions.  Meningococcal vaccine.** /  Consult your health care provider.  Hepatitis A vaccine.** / Consult your health care provider.  Hepatitis B vaccine.** / Consult your health care provider.  Haemophilus influenzae type b (Hib) vaccine.** / Consult your health care provider. Ages 80 years and over  Blood pressure check.** / Every year.  Lipid and cholesterol check.** / Every 5 years beginning at age 62 years.  Lung cancer  screening. / Every year if you are aged 32-80 years and have a 30-pack-year history of smoking and currently smoke or have quit within the past 15 years. Yearly screening is stopped once you have quit smoking for at least 15 years or develop a health problem that would prevent you from having lung cancer treatment.  Clinical breast exam.** / Every year after age 61 years.  BRCA-related cancer risk assessment.** / For women who have family members with a BRCA-related cancer (breast, ovarian, tubal, or peritoneal cancers).  Mammogram.** / Every year beginning at age 39 years and continuing for as long as you are in good health. Consult with your health care provider.  Pap test.** / Every 3 years starting at age 85 years through age 74 or 72 years with 3 consecutive normal Pap tests. Testing can be stopped between 65 and 70 years with 3 consecutive normal Pap tests and no abnormal Pap or HPV tests in the past 10 years.  HPV screening.** / Every 3 years from ages 55 years through ages 67 or 77 years with a history of 3 consecutive normal Pap tests. Testing can be stopped between 65 and 70 years with 3 consecutive normal Pap tests and no abnormal Pap or HPV tests in the past 10 years.  Fecal occult blood test (FOBT) of stool. / Every year beginning at age 81 years and continuing until age 22 years. You may not need to do this test if you get a colonoscopy every 10 years.  Flexible sigmoidoscopy or colonoscopy.** / Every 5 years for a flexible sigmoidoscopy or every 10 years for a colonoscopy beginning at age 67 years and continuing until age 22 years.  Hepatitis C blood test.** / For all people born from [birth year] through 1963 and any individual with known risks for hepatitis C.  Osteoporosis screening.** / A one-time screening for women ages 8 years and over and women at risk for fractures or osteoporosis.  Skin self-exam. / Monthly.  Influenza vaccine. / Every year.  Tetanus, diphtheria, and  acellular pertussis (Tdap/Td) vaccine.** / 1 dose of Td every 10 years.  Varicella vaccine.** / Consult your health care provider.  Zoster vaccine.** / 1 dose for adults aged 56 years or older.  Pneumococcal 13-valent conjugate (PCV13) vaccine.** / Consult your health care provider.  Pneumococcal polysaccharide (PPSV23) vaccine.** / 1 dose for all adults aged 15 years and older.  Meningococcal vaccine.** / Consult your health care provider.  Hepatitis A vaccine.** / Consult your health care provider.  Hepatitis B vaccine.** / Consult your health care provider.  Haemophilus influenzae type b (Hib) vaccine.** / Consult your health care provider. ** Family history and personal history of risk and conditions may change your health care provider's recommendations.   This information is not intended to replace advice given to you by your health care provider. Make sure you discuss any questions you have with your health care provider.   Document Released: 08/29/2001 Document Revised: 07/24/2014 Document Reviewed: 11/28/2010 Elsevier Interactive Patient Education [employer].

## 2016-03-03 LAB — CYTOLOGY - PAP

## 2016-05-01 ENCOUNTER — Telehealth: Payer: Self-pay | Admitting: Obstetrics and Gynecology

## 2016-05-01 NOTE — Telephone Encounter (Signed)
Left pt detailed message.

## 2016-05-01 NOTE — Telephone Encounter (Signed)
Pt called and she is taking a birthcontrol and skipped one day because she did not get her RX on time, but she sis take it as soon as she got it, but she has had a migraine for 3 days and having dark brown blood when she wipes. So she wanted to know if this could be due to skipping that pill or if this is something else, she would like a call back.

## 2017-02-12 ENCOUNTER — Telehealth: Payer: Self-pay | Admitting: Obstetrics and Gynecology

## 2017-02-12 ENCOUNTER — Other Ambulatory Visit: Payer: Self-pay | Admitting: *Deleted

## 2017-02-12 MED ORDER — LEVONORGESTREL-ETHINYL ESTRAD 90-20 MCG PO TABS: 1.0000 | ORAL_TABLET | Freq: Every day | ORAL | 0 refills | Status: DC

## 2017-02-12 NOTE — Telephone Encounter (Signed)
[facility] [address]

## 2017-02-12 NOTE — Telephone Encounter (Signed)
Done-ac 

## 2017-02-16 ENCOUNTER — Telehealth: Payer: Self-pay | Admitting: Obstetrics and Gynecology

## 2017-02-16 NOTE — Telephone Encounter (Signed)
Patient lvm wanting to schedule an appointment, for abnormal bleeding and cramping for 3 weeks. I called the patient and scheduled an appointment for 02/23/2017. That was the soonest available apt. Patient would also like a call before the appointment just to discuss symptoms and concerns. The patient did not disclose any other information, Please advise.

## 2017-02-19 ENCOUNTER — Telehealth: Payer: Self-pay | Admitting: Obstetrics and Gynecology

## 2017-02-19 NOTE — Telephone Encounter (Signed)
Patient is feeling weak and has a really bad headache today - she has been having bleeding and cramping for 3 weeks now -   Could she be seen ASAP  Please call

## 2017-02-20 NOTE — Telephone Encounter (Signed)
Please see if one of other midwives can see her before my schedule allows

## 2017-02-22 NOTE — Telephone Encounter (Signed)
Pt is coming in 02/23/17

## 2017-02-23 ENCOUNTER — Encounter: Payer: Self-pay | Admitting: Obstetrics and Gynecology

## 2017-02-23 ENCOUNTER — Ambulatory Visit (INDEPENDENT_AMBULATORY_CARE_PROVIDER_SITE_OTHER): Payer: Medicaid Other | Admitting: Obstetrics and Gynecology

## 2017-02-23 VITALS — BP 112/80 | HR 97 | Ht 63.0 in | Wt 119.5 lb

## 2017-02-23 DIAGNOSIS — Z3041 Encounter for surveillance of contraceptive pills: Secondary | ICD-10-CM

## 2017-02-23 DIAGNOSIS — N921 Excessive and frequent menstruation with irregular cycle: Secondary | ICD-10-CM

## 2017-02-23 LAB — POCT URINALYSIS DIPSTICK
BILIRUBIN UA: NEGATIVE
Blood, UA: NEGATIVE
Glucose, UA: NEGATIVE
Ketones, UA: NEGATIVE
LEUKOCYTES UA: NEGATIVE
Nitrite, UA: NEGATIVE
PH UA: 6 (ref 5.0–8.0)
PROTEIN UA: NEGATIVE
Spec Grav, UA: 1.01 (ref 1.010–1.025)
Urobilinogen, UA: 0.2 E.U./dL

## 2017-02-23 MED ORDER — LEVONORGESTREL-ETHINYL ESTRAD 90-20 MCG PO TABS: 1.0000 | ORAL_TABLET | Freq: Every day | ORAL | 11 refills | Status: DC

## 2017-02-23 NOTE — Addendum Note (Signed)
Addended by: Rosine BeatLONTZ, AMY L on: 02/23/2017 02:12 PM   Modules accepted: Orders

## 2017-02-23 NOTE — Progress Notes (Signed)
Subjective:     Patient ID: Enedina FinnerKristina Danziger, female   DOB: Sep 11, 1985, 31 y.o.   MRN: [id number]  HPI C/o daily spotting since July 15th. Did note switch of generic OCPs by pharmacy. Having daily cramping that is worse at night, mild nausea, decreased. Denies fever, urinary changes or bowel changes. Not had sex since started bleeding.  Review of Systems Negative except stated above in HPI    Objective:   Physical Exam A&Ox4 Well groomed female in no distress Blood pressure 112/80, pulse 97, height 5\' 3"  (1.6 m), weight 119 lb 8 oz (54.2 kg), last menstrual period 01/29/2017. Thyroid not enlarged Pelvic exam: normal external genitalia, vulva, vagina, cervix, uterus and adnexa, small amount bright red blood noted in vagina.    Assessment:     Breakthrough bleeding on OCPS    Plan:     Reassured of bleeding, and will notify pharmacy to change pills back to Lybrel only and not substitue. Will stop current pills x 5 days and start new pack, using back up method x 1 month.   RTC at next scheduled appointment or sooner if needed.

## 2017-03-08 ENCOUNTER — Encounter: Payer: Medicaid Other | Admitting: Obstetrics and Gynecology

## 2017-03-13 ENCOUNTER — Ambulatory Visit (INDEPENDENT_AMBULATORY_CARE_PROVIDER_SITE_OTHER): Payer: Medicaid Other | Admitting: Obstetrics and Gynecology

## 2017-03-13 ENCOUNTER — Encounter: Payer: Self-pay | Admitting: Obstetrics and Gynecology

## 2017-03-13 VITALS — BP 128/76 | HR 83 | Ht 63.0 in | Wt 120.1 lb

## 2017-03-13 DIAGNOSIS — Z716 Tobacco abuse counseling: Secondary | ICD-10-CM

## 2017-03-13 DIAGNOSIS — Z720 Tobacco use: Secondary | ICD-10-CM

## 2017-03-13 DIAGNOSIS — Z01419 Encounter for gynecological examination (general) (routine) without abnormal findings: Secondary | ICD-10-CM

## 2017-03-13 MED ORDER — NICOTINE 21 MG/24HR TD PT24: 21.0000 mg | MEDICATED_PATCH | Freq: Every day | TRANSDERMAL | 0 refills | Status: DC

## 2017-03-13 MED ORDER — NICOTINE 14 MG/24HR TD PT24: 14.0000 mg | MEDICATED_PATCH | Freq: Every day | TRANSDERMAL | 0 refills | Status: DC

## 2017-03-13 MED ORDER — NICOTINE 7 MG/24HR TD PT24: 7.0000 mg | MEDICATED_PATCH | Freq: Every day | TRANSDERMAL | 0 refills | Status: DC

## 2017-03-13 NOTE — Patient Instructions (Addendum)
Preventive Care 18-39 Years, Female Preventive care refers to lifestyle choices and visits with your health care provider that can promote health and wellness. What does preventive care include?  A yearly physical exam. This is also called an annual well check.  Dental exams once or twice a year.  Routine eye exams. Ask your health care provider how often you should have your eyes checked.  Personal lifestyle choices, including: ? Daily care of your teeth and gums. ? Regular physical activity. ? Eating a healthy diet. ? Avoiding tobacco and drug use. ? Limiting alcohol use. ? Practicing safe sex. ? Taking vitamin and mineral supplements as recommended by your health care provider. What happens during an annual well check? The services and screenings done by your health care provider during your annual well check will depend on your age, overall health, lifestyle risk factors, and family history of disease. Counseling Your health care provider may ask you questions about your:  Alcohol use.  Tobacco use.  Drug use.  Emotional well-being.  Home and relationship well-being.  Sexual activity.  Eating habits.  Work and work Statistician.  Method of birth control.  Menstrual cycle.  Pregnancy history.  Screening You may have the following tests or measurements:  Height, weight, and BMI.  Diabetes screening. This is done by checking your blood sugar (glucose) after you have not eaten for a while (fasting).  Blood pressure.  Lipid and cholesterol levels. These may be checked every 5 years starting at age 38.  Skin check.  Hepatitis C blood test.  Hepatitis B blood test.  Sexually transmitted disease (STD) testing.  BRCA-related cancer screening. This may be done if you have a family history of breast, ovarian, tubal, or peritoneal cancers.  Pelvic exam and Pap test. This may be done every 3 years starting at age 38. Starting at age 30, this may be done  every 5 years if you have a Pap test in combination with an HPV test.  Discuss your test results, treatment options, and if necessary, the need for more tests with your health care provider. Vaccines Your health care provider may recommend certain vaccines, such as:  Influenza vaccine. This is recommended every year.  Tetanus, diphtheria, and acellular pertussis (Tdap, Td) vaccine. You may need a Td booster every 10 years.  Varicella vaccine. You may need this if you have not been vaccinated.  HPV vaccine. If you are 39 or younger, you may need three doses over 6 months.  Measles, mumps, and rubella (MMR) vaccine. You may need at least one dose of MMR. You may also need a second dose.  Pneumococcal 13-valent conjugate (PCV13) vaccine. You may need this if you have certain conditions and were not previously vaccinated.  Pneumococcal polysaccharide (PPSV23) vaccine. You may need one or two doses if you smoke cigarettes or if you have certain conditions.  Meningococcal vaccine. One dose is recommended if you are age 68-21 years and a first-year college student living in a residence hall, or if you have one of several medical conditions. You may also need additional booster doses.  Hepatitis A vaccine. You may need this if you have certain conditions or if you travel or work in places where you may be exposed to hepatitis A.  Hepatitis B vaccine. You may need this if you have certain conditions or if you travel or work in places where you may be exposed to hepatitis B.  Haemophilus influenzae type b (Hib) vaccine. You may need this  if you have certain risk factors.  Talk to your health care provider about which screenings and vaccines you need and how often you need them. This information is not intended to replace advice given to you by your health care provider. Make sure you discuss any questions you have with your health care provider. Document Released: 08/29/2001 Document Revised:  03/22/2016 Document Reviewed: 05/04/2015 Elsevier Interactive Patient Education  2017 [employer].  Steps to Quit Smoking Smoking tobacco can be harmful to your health and can affect almost every organ in your body. Smoking puts you, and those around you, at risk for developing many serious chronic diseases. Quitting smoking is difficult, but it is one of the best things that you can do for your health. It is never too late to quit. What are the benefits of quitting smoking? When you quit smoking, you lower your risk of developing serious diseases and conditions, such as:  Lung cancer or lung disease, such as COPD.  Heart disease.  Stroke.  Heart attack.  Infertility.  Osteoporosis and bone fractures.  Additionally, symptoms such as coughing, wheezing, and shortness of breath may get better when you quit. You may also find that you get sick less often because your body is stronger at fighting off colds and infections. If you are pregnant, quitting smoking can help to reduce your chances of having a baby of low birth weight. How do I get ready to quit? When you decide to quit smoking, create a plan to make sure that you are successful. Before you quit:  Pick a date to quit. Set a date within the next two weeks to give you time to prepare.  Write down the reasons why you are quitting. Keep this list in places where you will see it often, such as on your bathroom mirror or in your car or wallet.  Identify the people, places, things, and activities that make you want to smoke (triggers) and avoid them. Make sure to take these actions: ? Throw away all cigarettes at home, at work, and in your car. ? Throw away smoking accessories, such as Scientist, research (medical). ? Clean your car and make sure to empty the ashtray. ? Clean your home, including curtains and carpets.  Tell your family, friends, and coworkers that you are quitting. Support from your loved ones can make quitting  easier.  Talk with your health care provider about your options for quitting smoking.  Find out what treatment options are covered by your health insurance.  What strategies can I use to quit smoking? Talk with your healthcare provider about different strategies to quit smoking. Some strategies include:  Quitting smoking altogether instead of gradually lessening how much you smoke over a period of time. Research shows that quitting "cold [place]" is more successful than gradually quitting.  Attending in-person counseling to help you build problem-solving skills. You are more likely to have success in quitting if you attend several counseling sessions. Even short sessions of 10 minutes can be effective.  Finding resources and support systems that can help you to quit smoking and remain smoke-free after you quit. These resources are most helpful when you use them often. They can include: ? Online chats with a Social worker. ? Telephone quitlines. ? Careers information officer. ? Support groups or group counseling. ? Text messaging programs. ? Mobile phone applications.  Taking medicines to help you quit smoking. (If you are pregnant or breastfeeding, talk with your health care provider first.) Some medicines contain  nicotine and some do not. Both types of medicines help with cravings, but the medicines that include nicotine help to relieve withdrawal symptoms. Your health care provider may recommend: ? Nicotine patches, gum, or lozenges. ? Nicotine inhalers or sprays. ? Non-nicotine medicine that is taken by mouth.  Talk with your health care provider about combining strategies, such as taking medicines while you are also receiving in-person counseling. Using these two strategies together makes you more likely to succeed in quitting than if you used either strategy on its own. If you are pregnant or breastfeeding, talk with your health care provider about finding counseling or other support  strategies to quit smoking. Do not take medicine to help you quit smoking unless told to do so by your health care provider. What things can I do to make it easier to quit? Quitting smoking might feel overwhelming at first, but there is a lot that you can do to make it easier. Take these important actions:  Reach out to your family and friends and ask that they support and encourage you during this time. Call telephone quitlines, reach out to support groups, or work with a counselor for support.  Ask people who smoke to avoid smoking around you.  Avoid places that trigger you to smoke, such as bars, parties, or smoke-break areas at work.  Spend time around people who do not smoke.  Lessen stress in your life, because stress can be a smoking trigger for some people. To lessen stress, try: ? Exercising regularly. ? Deep-breathing exercises. ? Yoga. ? Meditating. ? Performing a body scan. This involves closing your eyes, scanning your body from head to toe, and noticing which parts of your body are particularly tense. Purposefully relax the muscles in those areas.  Download or purchase mobile phone or tablet apps (applications) that can help you stick to your quit plan by providing reminders, tips, and encouragement. There are many free apps, such as QuitGuide from the [employer] Office manager for Disease Control and Prevention). You can find other support for quitting smoking (smoking cessation) through smokefree.gov and other websites.  How will I feel when I quit smoking? Within the first 24 hours of quitting smoking, you may start to feel some withdrawal symptoms. These symptoms are usually most noticeable 2-3 days after quitting, but they usually do not last beyond 2-3 weeks. Changes or symptoms that you might experience include:  Mood swings.  Restlessness, anxiety, or irritation.  Difficulty concentrating.  Dizziness.  Strong cravings for sugary foods in addition to nicotine.  Mild weight  gain.  Constipation.  Nausea.  Coughing or a sore throat.  Changes in how your medicines work in your body.  A depressed mood.  Difficulty sleeping (insomnia).  After the first 2-3 weeks of quitting, you may start to notice more positive results, such as:  Improved sense of smell and taste.  Decreased coughing and sore throat.  Slower heart rate.  Lower blood pressure.  Clearer skin.  The ability to breathe more easily.  Fewer sick days.  Quitting smoking is very challenging for most people. Do not get discouraged if you are not successful the first time. Some people need to make many attempts to quit before they achieve long-term success. Do your best to stick to your quit plan, and talk with your health care provider if you have any questions or concerns. This information is not intended to replace advice given to you by your health care provider. Make sure you discuss any questions  you have with your health care provider. Document Released: 06/27/2001 Document Revised: 02/29/2016 Document Reviewed: 11/17/2014 Elsevier Interactive Patient Education  2017 [employer].

## 2017-03-13 NOTE — Progress Notes (Signed)
Subjective:   Tara Mccullough is a 31 y.o. G21P0010 Caucasian female here for a routine well-woman exam.  No LMP recorded. Patient is not currently having periods (Reason: Oral contraceptives).    Current complaints: none PCP: me       doesn't desire labs  Social History: Sexual: heterosexual Marital Status: married Living situation: with spouse Occupation: homemaker Tobacco/alcohol: tobacco use: 1/2 to 3/4 ppd x 9 years. Illicit drugs: no history of illicit drug use  The following portions of the patient's history were reviewed and updated as appropriate: allergies, current medications, past family history, past medical history, past social history, past surgical history and problem list.  Past Medical History Past Medical History:  Diagnosis Date  . Ovarian cyst   . Painful menstrual periods   . Tobacco user   . Uses marijuana    last used 06/16/2015- previously used daily    Past Surgical History Past Surgical History:  Procedure Laterality Date  . DILATION AND EVACUATION N/A 08/02/2015   Procedure: DILATATION AND EVACUATION;  Surgeon: Herold Harms, MD;  Location: [facility] ORS;  Service: Gynecology;  Laterality: N/A;  . WISDOM TOOTH EXTRACTION      Gynecologic History G1P0010  No LMP recorded. Patient is not currently having periods (Reason: Oral contraceptives). Contraception: OCP (estrogen/progesterone) Last Pap: 2017. Results were: normal   Obstetric History OB History  Gravida Para Term Preterm AB Living  1       1    SAB TAB Ectopic Multiple Live Births  1            # Outcome Date GA Lbr Len/2nd Weight Sex Delivery Anes PTL Lv  1 SAB 08/12/15 [redacted]w[redacted]d       FD      Current Medications Current Outpatient Prescriptions on File Prior to Visit  Medication Sig Dispense Refill  . levonorgestrel-ethinyl estradiol (LYBREL,AMETHYST) 90-20 MCG tablet Take 1 tablet by mouth daily. 1 Package 11  . ibuprofen (ADVIL,MOTRIN) 800 MG tablet Take 1 tablet (800 mg  total) by mouth 3 (three) times daily. (Patient not taking: Reported on 02/23/2017) 30 tablet 1   No current facility-administered medications on file prior to visit.     Review of Systems Patient denies any headaches, blurred vision, shortness of breath, chest pain, abdominal pain, problems with bowel movements, urination, or intercourse.  Objective:  BP 128/76   Pulse 83   Ht 5\' 3"  (1.6 m)   Wt 120 lb 1.6 oz (54.5 kg)   BMI 21.27 kg/m  Physical Exam  General:  Well developed, well nourished, no acute distress. She is alert and oriented x3. Skin:  Warm and dry Neck:  Midline trachea, no thyromegaly or nodules Cardiovascular: Regular rate and rhythm, no murmur heard Lungs:  Effort normal, all lung fields clear to auscultation bilaterally Breasts:  No dominant palpable mass, retraction, or nipple discharge Abdomen:  Soft, non tender, no hepatosplenomegaly or masses Pelvic:  External genitalia is normal in appearance.  The vagina is normal in appearance. The cervix is bulbous, no CMT.  Thin prep pap is not done. Uterus is felt to be normal size, shape, and contour.  No adnexal masses or tenderness noted. Extremities:  No swelling or varicosities noted Psych:  She has a normal mood and affect  Assessment:   Healthy well-woman exam Smoker-desires cessation  Plan:  Nicotine patches prescribed.  F/U 1 year for AE Melody Suzan Nailer, CNM

## 2017-03-27 ENCOUNTER — Encounter: Payer: Self-pay | Admitting: Obstetrics and Gynecology

## 2017-10-29 ENCOUNTER — Other Ambulatory Visit: Payer: Self-pay | Admitting: Obstetrics and Gynecology

## 2017-11-26 ENCOUNTER — Other Ambulatory Visit: Payer: Self-pay | Admitting: Obstetrics and Gynecology

## 2017-11-26 MED ORDER — LEVONORGESTREL-ETHINYL ESTRAD 90-20 MCG PO TABS: 1.0000 | ORAL_TABLET | Freq: Every day | ORAL | 3 refills | Status: DC

## 2017-11-30 ENCOUNTER — Encounter: Payer: Self-pay | Admitting: Obstetrics and Gynecology

## 2017-12-23 ENCOUNTER — Emergency Department
Admission: EM | Admit: 2017-12-23 | Discharge: 2017-12-24 | Disposition: A | Discharge status: Home / Self Care | Payer: Self-pay | Attending: Emergency Medicine | Admitting: Emergency Medicine

## 2017-12-23 ENCOUNTER — Emergency Department: Payer: Self-pay

## 2017-12-23 ENCOUNTER — Encounter: Payer: Self-pay | Admitting: Emergency Medicine

## 2017-12-23 ENCOUNTER — Other Ambulatory Visit: Payer: Self-pay

## 2017-12-23 DIAGNOSIS — F17210 Nicotine dependence, cigarettes, uncomplicated: Secondary | ICD-10-CM | POA: Insufficient documentation

## 2017-12-23 DIAGNOSIS — R1012 Left upper quadrant pain: Secondary | ICD-10-CM | POA: Insufficient documentation

## 2017-12-23 DIAGNOSIS — N390 Urinary tract infection, site not specified: Secondary | ICD-10-CM | POA: Insufficient documentation

## 2017-12-23 DIAGNOSIS — R1110 Vomiting, unspecified: Secondary | ICD-10-CM

## 2017-12-23 DIAGNOSIS — R197 Diarrhea, unspecified: Secondary | ICD-10-CM

## 2017-12-23 DIAGNOSIS — Z79899 Other long term (current) drug therapy: Secondary | ICD-10-CM | POA: Insufficient documentation

## 2017-12-23 DIAGNOSIS — R55 Syncope and collapse: Secondary | ICD-10-CM | POA: Insufficient documentation

## 2017-12-23 LAB — CBC WITH DIFFERENTIAL/PLATELET
BASOS ABS: 0 10*3/uL (ref 0–0.1)
Basophils Relative: 0 %
Eosinophils Absolute: 0.2 10*3/uL (ref 0–0.7)
Eosinophils Relative: 1 %
HEMATOCRIT: 38.9 % (ref 35.0–47.0)
HEMOGLOBIN: 13.6 g/dL (ref 12.0–16.0)
Lymphocytes Relative: 39 %
Lymphs Abs: 4.6 10*3/uL — ABNORMAL HIGH (ref 1.0–3.6)
MCH: 32.8 pg (ref 26.0–34.0)
MCHC: 34.9 g/dL (ref 32.0–36.0)
MCV: 94 fL (ref 80.0–100.0)
MONOS PCT: 7 %
Monocytes Absolute: 0.9 10*3/uL (ref 0.2–0.9)
NEUTROS ABS: 6.2 10*3/uL (ref 1.4–6.5)
NEUTROS PCT: 53 %
Platelets: 275 10*3/uL (ref 150–440)
RBC: 4.13 MIL/uL (ref 3.80–5.20)
RDW: 12.5 % (ref 11.5–14.5)
WBC: 11.9 10*3/uL — ABNORMAL HIGH (ref 3.6–11.0)

## 2017-12-23 LAB — URINALYSIS, COMPLETE (UACMP) WITH MICROSCOPIC
Glucose, UA: NEGATIVE mg/dL
Hgb urine dipstick: NEGATIVE
KETONES UR: 5 mg/dL — AB
Nitrite: NEGATIVE
PH: 6 (ref 5.0–8.0)
PROTEIN: 100 mg/dL — AB
Specific Gravity, Urine: 1.029 (ref 1.005–1.030)

## 2017-12-23 LAB — HEPATIC FUNCTION PANEL
ALBUMIN: 3.6 g/dL (ref 3.5–5.0)
ALK PHOS: 47 U/L (ref 38–126)
ALT: 12 U/L — AB (ref 14–54)
AST: 20 U/L (ref 15–41)
Bilirubin, Direct: 0.1 mg/dL (ref 0.1–0.5)
Indirect Bilirubin: 0.3 mg/dL (ref 0.3–0.9)
TOTAL PROTEIN: 6.3 g/dL — AB (ref 6.5–8.1)
Total Bilirubin: 0.4 mg/dL (ref 0.3–1.2)

## 2017-12-23 LAB — BASIC METABOLIC PANEL
ANION GAP: 9 (ref 5–15)
BUN: 7 mg/dL (ref 6–20)
CHLORIDE: 106 mmol/L (ref 101–111)
CO2: 22 mmol/L (ref 22–32)
Calcium: 8.7 mg/dL — ABNORMAL LOW (ref 8.9–10.3)
Creatinine, Ser: 0.62 mg/dL (ref 0.44–1.00)
GFR calc non Af Amer: >60 mL/min (ref >60)
Glucose, Bld: 113 mg/dL — ABNORMAL HIGH (ref 65–99)
Potassium: 3.2 mmol/L — ABNORMAL LOW (ref 3.5–5.1)
Sodium: 137 mmol/L (ref 135–145)

## 2017-12-23 LAB — LIPASE, BLOOD: LIPASE: 27 U/L (ref 11–51)

## 2017-12-23 LAB — TROPONIN I: Troponin I: <0.03 ng/mL (ref <0.03)

## 2017-12-23 LAB — POC URINE PREG, ED: PREG TEST UR: NEGATIVE

## 2017-12-23 MED ORDER — ONDANSETRON HCL 4 MG PO TABS: 4.0000 mg | ORAL_TABLET | Freq: Every day | ORAL | 0 refills | Status: DC | PRN

## 2017-12-23 MED ORDER — DICYCLOMINE HCL 20 MG PO TABS: 20.0000 mg | ORAL_TABLET | Freq: Three times a day (TID) | ORAL | 0 refills | Status: DC | PRN

## 2017-12-23 MED ORDER — SODIUM CHLORIDE 0.9 % IV BOLUS
1000.0000 mL | Freq: Once | INTRAVENOUS | Status: AC
  Administered 2017-12-23: 1000 mL via INTRAVENOUS

## 2017-12-23 MED ORDER — ONDANSETRON HCL 4 MG/2ML IJ SOLN: 4.0000 mg | Freq: Once | INTRAMUSCULAR | Status: DC

## 2017-12-23 NOTE — Discharge Instructions (Addendum)
1.  Take medicines as needed for abdominal discomfort and nausea (Bentyl/Zofran). 2.  Drink clear liquids for 12 hours, then eat a bland diet for 3 days, then slowly advance diet as tolerated.  Avoid heavy, greasy, spicy foods and alcohol. 3.  Return to the ER for worsening symptoms, persistent vomiting, difficulty breathing or other concerns.

## 2017-12-23 NOTE — ED Triage Notes (Signed)
PT to ED via EMS from home with c/o multiple syncopal episodes over the last hour. PT had witnessed syncopal epiosde with EMS. MD at bedside. PT appears pale, VSS

## 2017-12-23 NOTE — ED Provider Notes (Signed)
[facility] Emergency Department Provider Note  ____________________________________________   First MD Initiated Contact with Patient 12/23/17 2133     (approximate)  I have reviewed the triage vital signs and the nursing notes.   HISTORY  Chief Complaint Near Syncope   HPI Tara Mccullough is a 32 y.o. female with a history of ovarian cyst as well as painful menstrual periods was presented to the emergency department after 2 syncopal episodes just prior to arrival.  They lasted only several seconds each.  Patient says that she felt lightheaded and hot before each episode.  Second episode witnessed by EMS and there seemed to be a tensing of the bilateral upper extremities but no definitive seizure activity and no postictal state.  Patient did not lose bowel or bladder continence.  No seizure history.  Patient says that she smokes cigarettes and marijuana daily.  Says that she also vomited twice yesterday and had diarrhea this morning and has been experiencing left upper quadrant cramping over the past week.  No known sick contacts.  Only one episode of diarrhea earlier today.  Patient able to eat and drink normally today.  Past Medical History:  Diagnosis Date  . Ovarian cyst   . Painful menstrual periods   . Tobacco user   . Uses marijuana    last used 06/16/2015- previously used daily    Patient Active Problem List   Diagnosis Date Noted  . Dysmenorrhea 01/20/2016  . Cigarette smoker 01/20/2016  . ANXIETY DEPRESSION 05/13/2010  . HEADACHE, CHRONIC 05/13/2010  . LEUKOCYTOSIS 05/09/2010    Past Surgical History:  Procedure Laterality Date  . DILATION AND EVACUATION N/A 08/02/2015   Procedure: DILATATION AND EVACUATION;  Surgeon: Herold HarmsMartin A Defrancesco, MD;  Location: [facility] ORS;  Service: Gynecology;  Laterality: N/A;  . WISDOM TOOTH EXTRACTION      Prior to Admission medications   Medication Sig Start Date End Date Taking? Authorizing Provider    levonorgestrel-ethinyl estradiol (LYBREL,AMETHYST) 90-20 MCG tablet Take 1 tablet by mouth daily. 11/26/17  Yes Shambley, Melody N, CNM    Allergies Patient has no known allergies.  Family History  Problem Relation Age of Onset  . Heart disease Father   . Diabetes Maternal Aunt   . Heart disease Maternal Grandmother   . Diabetes Maternal Grandfather   . Heart disease Paternal Grandmother   . Cancer Neg Hx     Social History Social History   Tobacco Use  . Smoking status: Current Every Day Smoker    Packs/day: 0.50    Types: Cigarettes  . Smokeless tobacco: Never Used  Substance Use Topics  . Alcohol use: Yes    Comment: occas  . Drug use: Yes    Types: Marijuana    Comment: 06/16/2015 last used- prior to pregnancy used MJ- qd    Review of Systems  Constitutional: No fever/chills Eyes: No visual changes. ENT: No sore throat. Cardiovascular: Denies chest pain. Respiratory: Denies shortness of breath. Gastrointestinal:  No constipation. Genitourinary: Negative for dysuria. Musculoskeletal: Negative for back pain. Skin: Negative for rash. Neurological: Negative for headaches, focal weakness or numbness.   ____________________________________________   PHYSICAL EXAM:  VITAL SIGNS: ED Triage Vitals [12/23/17 2128]  Enc Vitals Group     BP 120/73     Pulse Rate 88     Resp 16     Temp 97.8 F (36.6 C)     Temp Source Oral     SpO2 98 %  Weight      Height      Head Circumference      Peak Flow      Pain Score      Pain Loc      Pain Edu?      Excl. in GC?     Constitutional: Alert and oriented. Well appearing and in no acute distress. Eyes: Conjunctivae are normal.  Head: Atraumatic. Nose: No congestion/rhinnorhea. Mouth/Throat: Mucous membranes are moist.  Neck: No stridor.   Cardiovascular: Normal rate, regular rhythm. Grossly normal heart sounds.   Respiratory: Normal respiratory effort.  No retractions. Lungs CTAB. Gastrointestinal: Soft  and nontender. No distention.  Musculoskeletal: No lower extremity tenderness nor edema.  No joint effusions. Neurologic:  Normal speech and language. No gross focal neurologic deficits are appreciated. Skin:  Skin is warm, dry and intact. No rash noted. Psychiatric: Mood and affect are normal. Speech and behavior are normal.  ____________________________________________   LABS (all labs ordered are listed, but only abnormal results are displayed)  Labs Reviewed  URINALYSIS, COMPLETE (UACMP) WITH MICROSCOPIC - Abnormal; Notable for the following components:      Result Value   Color, Urine AMBER (*)    APPearance CLOUDY (*)    Bilirubin Urine SMALL (*)    Ketones, ur 5 (*)    Protein, ur 100 (*)    Leukocytes, UA TRACE (*)    Bacteria, UA RARE (*)    All other components within normal limits  CBC WITH DIFFERENTIAL/PLATELET - Abnormal; Notable for the following components:   WBC 11.9 (*)    Lymphs Abs 4.6 (*)    All other components within normal limits  BASIC METABOLIC PANEL - Abnormal; Notable for the following components:   Potassium 3.2 (*)    Glucose, Bld 113 (*)    Calcium 8.7 (*)    All other components within normal limits  HEPATIC FUNCTION PANEL - Abnormal; Notable for the following components:   Total Protein 6.3 (*)    ALT 12 (*)    All other components within normal limits  TROPONIN I  LIPASE, BLOOD  FIBRIN DERIVATIVES D-DIMER ([facility] ONLY)  POC URINE PREG, ED   ____________________________________________  EKG  ED ECG REPORT I, Arelia Longest, the attending physician, personally viewed and interpreted this ECG.   Date: 12/23/2017  EKG Time: 2129  Rate: 94  Rhythm: normal sinus rhythm  Axis: Normal  Intervals:none  ST&T Change: No ST segment elevation or depression.  No abnormal T wave inversion.  ____________________________________________  RADIOLOGY  No acute finding on the chest x-ray nor head  CT. ____________________________________________   PROCEDURES  Procedure(s) performed:   Procedures  Critical Care performed:   ____________________________________________   INITIAL IMPRESSION / ASSESSMENT AND PLAN / ED COURSE  Pertinent labs & imaging results that were available during my care of the patient were reviewed by me and considered in my medical decision making (see chart for details).  Differential diagnosis includes, but is not limited to, biliary disease (biliary colic, acute cholecystitis, cholangitis, choledocholithiasis, etc), intrathoracic causes for epigastric abdominal pain including ACS, gastritis, duodenitis, pancreatitis, small bowel or large bowel obstruction, abdominal aortic aneurysm, hernia, and ulcer(s). Dehydration, near-syncope, syncope, seizure, less short abnormality As part of my medical decision making, I reviewed the following data within the electronic MEDICAL RECORD NUMBER Notes from prior outpatient visits as well as OB/GYN visits.  ----------------------------------------- 11:29 PM on 12/23/2017 -----------------------------------------  Patient improved but with persistent left upper quadrant/lower left thoracic  pain that worsens with deep breathing.  Patient is on estrogen-containing birth control pill.  Reassuring vital signs.  Reassuring labs thus far.  Pending d-dimer at this time.  Less likely to be seizure.  Given overall clinical presentation more likely to be syncopal episodes.  If d-dimer is normal I believe the patient may be safely discharged home.  Signed out to Dr. Dolores Frame. ____________________________________________   FINAL CLINICAL IMPRESSION(S) / ED DIAGNOSES  Syncope.  Left upper quadrant abdominal pain.  Vomiting and diarrhea.    NEW MEDICATIONS STARTED DURING THIS VISIT:  New Prescriptions   No medications on file     Note:  This document was prepared using Dragon voice recognition software and may include  unintentional dictation errors.     Myrna Blazer, MD 12/23/17 2330

## 2017-12-24 ENCOUNTER — Emergency Department: Payer: Self-pay

## 2017-12-24 ENCOUNTER — Encounter: Payer: Self-pay | Admitting: Radiology

## 2017-12-24 LAB — FIBRIN DERIVATIVES D-DIMER (ARMC ONLY): FIBRIN DERIVATIVES D-DIMER (ARMC): 636.93 ng{FEU}/mL — AB (ref 0.00–499.00)

## 2017-12-24 MED ORDER — SODIUM CHLORIDE 0.9 % IV BOLUS
1000.0000 mL | Freq: Once | INTRAVENOUS | Status: AC
  Administered 2017-12-24: 1000 mL via INTRAVENOUS

## 2017-12-24 MED ORDER — FOSFOMYCIN TROMETHAMINE 3 G PO PACK
3.0000 g | PACK | Freq: Once | ORAL | Status: AC
  Administered 2017-12-24: 3 g via ORAL
  Filled 2017-12-24: qty 3

## 2017-12-24 MED ORDER — IOPAMIDOL (ISOVUE-370) INJECTION 76%
75.0000 mL | Freq: Once | INTRAVENOUS | Status: AC | PRN
  Administered 2017-12-24: 75 mL via INTRAVENOUS

## 2017-12-24 MED ORDER — ONDANSETRON HCL 4 MG/2ML IJ SOLN
4.0000 mg | Freq: Once | INTRAMUSCULAR | Status: AC
  Administered 2017-12-24: 4 mg via INTRAVENOUS
  Filled 2017-12-24: qty 2

## 2017-12-24 NOTE — ED Notes (Signed)
Pt assisted to bathroom

## 2017-12-24 NOTE — ED Notes (Signed)
Blue top drawn and sent to lab.

## 2017-12-24 NOTE — ED Provider Notes (Signed)
-----------------------------------------   1:19 AM on 12/24/2017 -----------------------------------------  Patient feeling significantly better.  Updated patient and spouse on elevated d-dimer result.  Will obtain CT chest to evaluate for PE.  Second liter IV fluids administered.   ----------------------------------------- 2:10 AM on 12/24/2017 -----------------------------------------  ED interpretation of CT chest: No PE  CT chest interpreted per Dr. Sterling BigKwon: Clear lungs without evidence of acute pulmonary embolus.  She resting no acute distress.  Updated patient and family member of negative CT chest.  Will discharge home with Zofran and Bentyl as prescribed per previous provider as needed.  Will follow-up closely with her PCP this week.  Strict return precautions given.  Patient and spouse verbalize understanding and agree with plan of care.     Irean HongSung, Jade J, MD 12/24/17 [phone]

## 2017-12-24 NOTE — ED Notes (Signed)
Report received on pt. Care assumed.  

## 2018-01-13 ENCOUNTER — Observation Stay: Payer: Self-pay

## 2018-01-13 ENCOUNTER — Observation Stay
Admission: EM | Admit: 2018-01-13 | Discharge: 2018-01-14 | Disposition: A | Discharge status: Home / Self Care | Payer: Self-pay | Attending: Internal Medicine | Admitting: Internal Medicine

## 2018-01-13 ENCOUNTER — Other Ambulatory Visit: Payer: Self-pay

## 2018-01-13 DIAGNOSIS — F4310 Post-traumatic stress disorder, unspecified: Secondary | ICD-10-CM | POA: Insufficient documentation

## 2018-01-13 DIAGNOSIS — F1290 Cannabis use, unspecified, uncomplicated: Secondary | ICD-10-CM | POA: Insufficient documentation

## 2018-01-13 DIAGNOSIS — Z833 Family history of diabetes mellitus: Secondary | ICD-10-CM | POA: Insufficient documentation

## 2018-01-13 DIAGNOSIS — R55 Syncope and collapse: Principal | ICD-10-CM

## 2018-01-13 DIAGNOSIS — R9431 Abnormal electrocardiogram [ECG] [EKG]: Secondary | ICD-10-CM

## 2018-01-13 DIAGNOSIS — R0600 Dyspnea, unspecified: Secondary | ICD-10-CM | POA: Insufficient documentation

## 2018-01-13 DIAGNOSIS — F329 Major depressive disorder, single episode, unspecified: Secondary | ICD-10-CM | POA: Insufficient documentation

## 2018-01-13 DIAGNOSIS — R0789 Other chest pain: Secondary | ICD-10-CM | POA: Insufficient documentation

## 2018-01-13 DIAGNOSIS — F17210 Nicotine dependence, cigarettes, uncomplicated: Secondary | ICD-10-CM | POA: Insufficient documentation

## 2018-01-13 DIAGNOSIS — I4581 Long QT syndrome: Secondary | ICD-10-CM | POA: Insufficient documentation

## 2018-01-13 DIAGNOSIS — Z79899 Other long term (current) drug therapy: Secondary | ICD-10-CM | POA: Insufficient documentation

## 2018-01-13 DIAGNOSIS — Z8249 Family history of ischemic heart disease and other diseases of the circulatory system: Secondary | ICD-10-CM | POA: Insufficient documentation

## 2018-01-13 DIAGNOSIS — F419 Anxiety disorder, unspecified: Secondary | ICD-10-CM | POA: Insufficient documentation

## 2018-01-13 HISTORY — DX: Anxiety disorder, unspecified: F41.9

## 2018-01-13 HISTORY — DX: Major depressive disorder, single episode, unspecified: F32.9

## 2018-01-13 HISTORY — DX: Post-traumatic stress disorder, unspecified: F43.10

## 2018-01-13 HISTORY — DX: Depression, unspecified: F32.A

## 2018-01-13 LAB — BASIC METABOLIC PANEL
Anion gap: 10 (ref 5–15)
BUN: 6 mg/dL (ref 6–20)
CALCIUM: 9.6 mg/dL (ref 8.9–10.3)
CO2: 22 mmol/L (ref 22–32)
CREATININE: 0.59 mg/dL (ref 0.44–1.00)
Chloride: 108 mmol/L (ref 98–111)
GFR calc Af Amer: >60 mL/min (ref >60)
GFR calc non Af Amer: >60 mL/min (ref >60)
Glucose, Bld: 134 mg/dL — ABNORMAL HIGH (ref 70–99)
Potassium: 3.7 mmol/L (ref 3.5–5.1)
SODIUM: 140 mmol/L (ref 135–145)

## 2018-01-13 LAB — HEPATIC FUNCTION PANEL
ALBUMIN: 4.6 g/dL (ref 3.5–5.0)
ALT: 22 U/L (ref 0–44)
AST: 27 U/L (ref 15–41)
Alkaline Phosphatase: 54 U/L (ref 38–126)
Bilirubin, Direct: <0.1 mg/dL (ref 0.0–0.2)
Total Bilirubin: 0.5 mg/dL (ref 0.3–1.2)
Total Protein: 7.7 g/dL (ref 6.5–8.1)

## 2018-01-13 LAB — URINALYSIS, COMPLETE (UACMP) WITH MICROSCOPIC
BILIRUBIN URINE: NEGATIVE
GLUCOSE, UA: NEGATIVE mg/dL
Hgb urine dipstick: NEGATIVE
Ketones, ur: 5 mg/dL — AB
LEUKOCYTES UA: NEGATIVE
NITRITE: NEGATIVE
PH: 5 (ref 5.0–8.0)
Protein, ur: NEGATIVE mg/dL
Specific Gravity, Urine: 1.01 (ref 1.005–1.030)

## 2018-01-13 LAB — URINE DRUG SCREEN, QUALITATIVE (ARMC ONLY)
AMPHETAMINES, UR SCREEN: NOT DETECTED
BENZODIAZEPINE, UR SCRN: NOT DETECTED
Cannabinoid 50 Ng, Ur ~~LOC~~: POSITIVE — AB
Cocaine Metabolite,Ur ~~LOC~~: NOT DETECTED
MDMA (Ecstasy)Ur Screen: NOT DETECTED
Methadone Scn, Ur: NOT DETECTED
OPIATE, UR SCREEN: NOT DETECTED
Phencyclidine (PCP) Ur S: NOT DETECTED
Tricyclic, Ur Screen: NOT DETECTED

## 2018-01-13 LAB — CBC
HCT: 45.3 % (ref 35.0–47.0)
Hemoglobin: 15.7 g/dL (ref 12.0–16.0)
MCH: 32.7 pg (ref 26.0–34.0)
MCHC: 34.6 g/dL (ref 32.0–36.0)
MCV: 94.5 fL (ref 80.0–100.0)
PLATELETS: 302 10*3/uL (ref 150–440)
RBC: 4.79 MIL/uL (ref 3.80–5.20)
RDW: 12.5 % (ref 11.5–14.5)
WBC: 9.1 10*3/uL (ref 3.6–11.0)

## 2018-01-13 LAB — POCT PREGNANCY, URINE: Preg Test, Ur: NEGATIVE

## 2018-01-13 LAB — TSH: TSH: 0.605 u[IU]/mL (ref 0.350–4.500)

## 2018-01-13 LAB — T4, FREE: FREE T4: 0.93 ng/dL (ref 0.82–1.77)

## 2018-01-13 LAB — MAGNESIUM: Magnesium: 2.3 mg/dL (ref 1.7–2.4)

## 2018-01-13 LAB — LIPASE, BLOOD: Lipase: 26 U/L (ref 11–51)

## 2018-01-13 MED ORDER — ACETAMINOPHEN 650 MG RE SUPP: 650.0000 mg | Freq: Four times a day (QID) | RECTAL | Status: DC | PRN

## 2018-01-13 MED ORDER — ONDANSETRON HCL 4 MG PO TABS: 4.0000 mg | ORAL_TABLET | Freq: Four times a day (QID) | ORAL | Status: DC | PRN

## 2018-01-13 MED ORDER — NICOTINE 14 MG/24HR TD PT24
14.0000 mg | MEDICATED_PATCH | Freq: Every day | TRANSDERMAL | Status: DC
  Administered 2018-01-14: 14 mg via TRANSDERMAL
  Filled 2018-01-13: qty 1

## 2018-01-13 MED ORDER — POTASSIUM CHLORIDE 20 MEQ PO PACK
20.0000 meq | PACK | Freq: Two times a day (BID) | ORAL | Status: DC
  Administered 2018-01-13 – 2018-01-14 (×3): 20 meq via ORAL
  Filled 2018-01-13 (×3): qty 1

## 2018-01-13 MED ORDER — POTASSIUM CHLORIDE CRYS ER 20 MEQ PO TBCR: 40.0000 meq | EXTENDED_RELEASE_TABLET | Freq: Once | ORAL | Status: DC

## 2018-01-13 MED ORDER — SODIUM CHLORIDE 0.9 % IV SOLN
INTRAVENOUS | Status: AC
  Administered 2018-01-13 – 2018-01-14 (×3): via INTRAVENOUS

## 2018-01-13 MED ORDER — NICOTINE 14 MG/24HR TD PT24: 14.0000 mg | MEDICATED_PATCH | Freq: Every day | TRANSDERMAL | Status: DC

## 2018-01-13 MED ORDER — PROPRANOLOL HCL 10 MG PO TABS
10.0000 mg | ORAL_TABLET | Freq: Two times a day (BID) | ORAL | Status: DC
  Administered 2018-01-13 – 2018-01-14 (×3): 10 mg via ORAL
  Filled 2018-01-13 (×5): qty 1

## 2018-01-13 MED ORDER — ENOXAPARIN SODIUM 40 MG/0.4ML ~~LOC~~ SOLN
40.0000 mg | SUBCUTANEOUS | Status: DC
  Administered 2018-01-13: 40 mg via SUBCUTANEOUS
  Filled 2018-01-13: qty 0.4

## 2018-01-13 MED ORDER — ONDANSETRON HCL 4 MG/2ML IJ SOLN: 4.0000 mg | Freq: Four times a day (QID) | INTRAMUSCULAR | Status: DC | PRN

## 2018-01-13 MED ORDER — PROMETHAZINE HCL 25 MG/ML IJ SOLN: 12.5000 mg | Freq: Four times a day (QID) | INTRAMUSCULAR | Status: DC | PRN

## 2018-01-13 MED ORDER — ACETAMINOPHEN 325 MG PO TABS: 650.0000 mg | ORAL_TABLET | Freq: Four times a day (QID) | ORAL | Status: DC | PRN

## 2018-01-13 MED ORDER — PROMETHAZINE HCL 25 MG PO TABS
25.0000 mg | ORAL_TABLET | Freq: Four times a day (QID) | ORAL | Status: DC | PRN
  Filled 2018-01-13: qty 1

## 2018-01-13 NOTE — Consult Note (Signed)
CARDIOLOGY CONSULT NOTE       Patient ID: Tara Mccullough MRN: [id number] DOB/AGE: 15-May-1986 32 y.o.  Admit date: 01/13/2018 Referring Physician: Nemiah Commander Primary Physician: Patient, No Pcp Per Primary Cardiologist: None  Reason for Consultation: Pre Syncope Long QT  Active Problems:   Recurrent syncope   HPI:  31 y.o. recurrent ER visits for nausea and pre-syncope Having episodes of nausea that then trigger palpitations and tachycardia. She gets anxious And then feels lightheaded. Seen in ER 12/23/17 with 2 "spells"  Felt hot. One episode seen by EMS and had pulse with tensing of both UE;s but no definitive seizure activity or postictal state. She smokes cigarettes and marijuana daily Source of [place] [employer] is long term and known with no others with same supply being sick Pregnancy test negative x 2 and no history of GI issues. Does have history of ovarian cyst and painful menses but spells do not seem related to this. In ER then and today no arrhythmias noted. Previous ECG normal and today has higher HR 106 with QT 490 corrected for HR over 600 No high risk family history of syncope or sudden death. No chest pain CTA negative for PE No history of murmur hypoxia or congenital heart disease   ROS All other systems reviewed and negative except as noted above  Past Medical History:  Diagnosis Date  . Anxiety   . Depression   . Ovarian cyst   . Painful menstrual periods   . PTSD (post-traumatic stress disorder)   . Tobacco user   . Uses marijuana    last used 06/16/2015- previously used daily    Family History  Problem Relation Age of Onset  . Heart disease Father   . Diabetes Maternal Aunt   . Heart disease Maternal Grandmother   . Diabetes Maternal Grandfather   . Heart disease Paternal Grandmother   . Cancer Neg Hx     Social History   Socioeconomic History  . Marital status: Divorced    Spouse name: Not on file  . Number of children: Not on file  . Years of  education: Not on file  . Highest education level: Not on file  Occupational History  . Not on file  Social Needs  . Financial resource strain: Not on file  . Food insecurity:    Worry: Not on file    Inability: Not on file  . Transportation needs:    Medical: Not on file    Non-medical: Not on file  Tobacco Use  . Smoking status: Current Every Day Smoker    Packs/day: 0.50    Types: Cigarettes  . Smokeless tobacco: Never Used  Substance and Sexual Activity  . Alcohol use: Yes    Comment: occas  . Drug use: Yes    Types: Marijuana    Comment: 06/16/2015 last used- prior to pregnancy used MJ- qd  . Sexual activity: Yes    Birth control/protection: None, Pill  Lifestyle  . Physical activity:    Days per week: Not on file    Minutes per session: Not on file  . Stress: Not on file  Relationships  . Social connections:    Talks on phone: Not on file    Gets together: Not on file    Attends religious service: Not on file    Active member of club or organization: Not on file    Attends meetings of clubs or organizations: Not on file    Relationship status: Not on file  .  Intimate partner violence:    Fear of current or ex partner: Not on file    Emotionally abused: Not on file    Physically abused: Not on file    Forced sexual activity: Not on file  Other Topics Concern  . Not on file  Social History Narrative   Lives at ome with husband   Independent at baseline    Past Surgical History:  Procedure Laterality Date  . DILATION AND EVACUATION N/A 08/02/2015   Procedure: DILATATION AND EVACUATION;  Surgeon: Herold HarmsMartin A Defrancesco, MD;  Location: [facility] ORS;  Service: Gynecology;  Laterality: N/A;  . WISDOM TOOTH EXTRACTION       . enoxaparin (LOVENOX) injection  40 mg Subcutaneous Q24H  . potassium chloride  20 mEq Oral BID   . sodium chloride      Physical Exam: Blood pressure (!) 132/91, pulse 78, temperature 98 F (36.7 C), temperature source Oral, resp. rate 18,  height 5\' 3"  (1.6 m), weight 115 lb (52.2 kg), SpO2 98 %.    Affect appropriate Healthy:  appears stated age HEENT: normal Neck supple with no adenopathy JVP normal no bruits no thyromegaly Lungs clear with no wheezing and good diaphragmatic motion Heart:  S1/S2 no murmur, no rub, gallop or click PMI normal Abdomen: benighn, BS positve, no tenderness, no AAA no bruit.  No HSM or HJR Distal pulses intact with no bruits No edema Neuro non-focal Skin warm and dry No muscular weakness   Labs:   Lab Results  Component Value Date   WBC 9.1 01/13/2018   HGB 15.7 01/13/2018   HCT 45.3 01/13/2018   MCV 94.5 01/13/2018   PLT 302 01/13/2018    Recent Labs  Lab 01/13/18 1129  NA 140  K 3.7  CL 108  CO2 22  BUN 6  CREATININE 0.59  CALCIUM 9.6  PROT 7.7  BILITOT 0.5  ALKPHOS 54  ALT 22  AST 27  GLUCOSE 134*   Lab Results  Component Value Date   TROPONINI <0.03 12/23/2017   No results found for: CHOL No results found for: HDL No results found for: LDLCALC No results found for: TRIG No results found for: CHOLHDL No results found for: LDLDIRECT    Radiology: Dg Chest 2 View  Result Date: 12/23/2017 CLINICAL DATA:  Left lower chest pain with dyspnea. EXAM: CHEST - 2 VIEW COMPARISON:  None. FINDINGS: The heart size and mediastinal contours are within normal limits. Both lungs are clear. The visualized skeletal structures are unremarkable. IMPRESSION: No active cardiopulmonary disease. Electronically Signed   By: Tollie Ethavid  Kwon M.D.   On: 12/23/2017 22:25   Ct Head Wo Contrast  Result Date: 12/23/2017 CLINICAL DATA:  Seizure versus syncope.  New onset, nontraumatic. EXAM: CT HEAD WITHOUT CONTRAST TECHNIQUE: Contiguous axial images were obtained from the base of the skull through the vertex without intravenous contrast. COMPARISON:  None. FINDINGS: Brain: No intracranial hemorrhage, mass effect, or midline shift. No hydrocephalus. The basilar cisterns are patent. No evidence of  territorial infarct or acute ischemia. No extra-axial or intracranial fluid collection. Vascular: No hyperdense vessel or unexpected calcification. Skull: No fracture or focal lesion. Sinuses/Orbits: Paranasal sinuses and mastoid air cells are clear. The visualized orbits are unremarkable. Other: None. IMPRESSION: Unremarkable noncontrast head CT. Electronically Signed   By: Rubye OaksMelanie  Ehinger M.D.   On: 12/23/2017 22:18   Ct Angio Chest Pe W/cm &/or Wo Cm  Result Date: 12/24/2017 CLINICAL DATA:  Multiple syncopal episodes in the past hour.  EXAM: CT ANGIOGRAPHY CHEST WITH CONTRAST TECHNIQUE: Multidetector CT imaging of the chest was performed using the standard protocol during bolus administration of intravenous contrast. Multiplanar CT image reconstructions and MIPs were obtained to evaluate the vascular anatomy. CONTRAST:  75mL ISOVUE-370 IOPAMIDOL (ISOVUE-370) INJECTION 76% COMPARISON:  None. FINDINGS: Cardiovascular: The study is of quality for the evaluation of pulmonary embolism. There are no filling defects in the central, lobar, segmental or subsegmental pulmonary artery branches to suggest acute pulmonary embolism. Great vessels are normal in course and caliber. Normal heart size. No significant pericardial fluid/thickening. Mediastinum/Nodes: No discrete thyroid nodules. Unremarkable esophagus. No pathologically enlarged axillary, mediastinal or hilar lymph nodes. Lungs/Pleura: No pneumothorax. No pleural effusion. Minimal biapical pleuroparenchymal scarring and/or atelectasis. Upper abdomen: Unremarkable. Musculoskeletal:  No aggressive appearing focal osseous lesions. Review of the MIP images confirms the above findings. IMPRESSION: Clear lungs without evidence of acute pulmonary embolus. Electronically Signed   By: Tollie Eth M.D.   On: 12/24/2017 01:58   US Carotid Bilateral  Result Date: 01/13/2018 CLINICAL DATA:  Syncope EXAM: BILATERAL CAROTID DUPLEX ULTRASOUND TECHNIQUE: Wallace Cullens scale imaging,  color Doppler and duplex ultrasound were performed of bilateral carotid and vertebral arteries in the neck. COMPARISON:  Head CT 12/23/2017 FINDINGS: Criteria: Quantification of carotid stenosis is based on velocity parameters that correlate the residual internal carotid diameter with NASCET-based stenosis levels, using the diameter of the distal internal carotid lumen as the denominator for stenosis measurement. The following velocity measurements were obtained: RIGHT ICA: 155/27 cm/sec CCA: 144/43 cm/sec SYSTOLIC ICA/CCA RATIO: 1.1 ECA:  108 cm/sec LEFT ICA: 136/44 cm/sec CCA: 164/40 cm/sec SYSTOLIC ICA/CCA RATIO: 0.8 ECA:  132 cm/sec RIGHT CAROTID ARTERY: Negative.  No visible plaque. RIGHT VERTEBRAL ARTERY:  Patent and antegrade. LEFT CAROTID ARTERY:  Negative.  No visible plaque. LEFT VERTEBRAL ARTERY:  Patent and antegrade. IMPRESSION: Normal carotid Doppler. Electronically Signed   By: Marnee Spring M.D.   On: 01/13/2018 15:08     EKG: SR rate 106 QT 490  QT prolonged compared to 12/23/17 and rate higher with nonspecific ST chagnes   ASSESSMENT AND PLAN:   Pre Syncope:  Normal exam no high risk family history would be unusual to be cardiac in nature with episodes lasting 30 minutes and EMS witnessing one episode with stable pulse. Only issue is change in QT. Given relative high BP and tachycardia will start low dose propanolol, watch on telemetry and order TTE. May benefit from outpatient 30 day event monitor or ILR Suggest she also have neurology consult possible EEG  [place]ajuana Use:  Counseled on cessation as it is illegal   Nausea:  Etiology not clear but this triggers her events May all be vagal episodes but unusual to be so frequent  Consider further w/u Abdominal exam is benign avoid zofran and other QT prolonging drugs  K:  Does not like large pills will change Kdur to packets 20 MeQ   Signed: Charlton Haws 01/13/2018, 3:18 PM

## 2018-01-13 NOTE — Care Management (Signed)
Patient with recurrent ER visits for nausea and pre-syncope Having episodes of nausea that then trigger palpitations and tachycardia. RNCM consulted on patient as she is without insurance. Has no PCP and just utilizes her GYN doctor. Currently not on any medications but may beat discharge. [facility] and Medication management application given to patient. Other indigent resources provided. RNCM will follow for possible needs at discharge.

## 2018-01-13 NOTE — ED Provider Notes (Signed)
[facility] Emergency Department Provider Note   ____________________________________________   First MD Initiated Contact with Patient 01/13/18 1231     (approximate)  I have reviewed the triage vital signs and the nursing notes.   HISTORY  Chief Complaint Weakness    HPI Tara Mccullough is a 32 y.o. female previous history of syncopal episode about 2 weeks ago  Patient reports she also takes birth control, but otherwise no medications.  She was seen about 2 weeks ago after she had an episode where she passed out, since that time once or twice a day is been experiencing feelings were she will suddenly get sweaty, nauseated occasionally vomit.  She had left-sided abdominal pain when she was seen 2 weeks ago but reports that is completely gone away now.  No fevers or chills.  No chest pain.  No shortness of breath.  She does smoke, occasionally uses marijuana as well, but reports never using synthetic marijuana.      Past Medical History:  Diagnosis Date  . Ovarian cyst   . Painful menstrual periods   . Tobacco user   . Uses marijuana    last used 06/16/2015- previously used daily    Patient Active Problem List   Diagnosis Date Noted  . Dysmenorrhea 01/20/2016  . Cigarette smoker 01/20/2016  . ANXIETY DEPRESSION 05/13/2010  . HEADACHE, CHRONIC 05/13/2010  . LEUKOCYTOSIS 05/09/2010    Past Surgical History:  Procedure Laterality Date  . DILATION AND EVACUATION N/A 08/02/2015   Procedure: DILATATION AND EVACUATION;  Surgeon: Herold HarmsMartin A Defrancesco, MD;  Location: [facility] ORS;  Service: Gynecology;  Laterality: N/A;  . WISDOM TOOTH EXTRACTION    No family history of sudden unexplained sudden deaths  Prior to Admission medications   Medication Sig Start Date End Date Taking? Authorizing Provider  dicyclomine (BENTYL) 20 MG tablet Take 1 tablet (20 mg total) by mouth 3 (three) times daily as needed for spasms. 12/23/17 12/23/18  Myrna BlazerSchaevitz, David  Matthew, MD  levonorgestrel-ethinyl estradiol (LYBREL,AMETHYST) 90-20 MCG tablet Take 1 tablet by mouth daily. 11/26/17   Shambley, Melody N, CNM  ondansetron (ZOFRAN) 4 MG tablet Take 1 tablet (4 mg total) by mouth daily as needed. 12/23/17   Schaevitz, Myra Rudeavid Matthew, MD    Allergies Patient has no known allergies.  Family History  Problem Relation Age of Onset  . Heart disease Father   . Diabetes Maternal Aunt   . Heart disease Maternal Grandmother   . Diabetes Maternal Grandfather   . Heart disease Paternal Grandmother   . Cancer Neg Hx     Social History Social History   Tobacco Use  . Smoking status: Current Every Day Smoker    Packs/day: 0.50    Types: Cigarettes  . Smokeless tobacco: Never Used  Substance Use Topics  . Alcohol use: Yes    Comment: occas  . Drug use: Yes    Types: Marijuana    Comment: 06/16/2015 last used- prior to pregnancy used MJ- qd    Review of Systems Constitutional: No fever/chills Eyes: No visual changes. ENT: No sore throat. Cardiovascular: Denies chest pain. Respiratory: Denies shortness of breath. Gastrointestinal: No abdominal pain.  No diarrhea.  No constipation. Genitourinary: Negative for dysuria.  Denies pregnancy.  On birth control last menstrual cycle of couple months ago. Musculoskeletal: Negative for back pain. Skin: Negative for rash. Neurological: Negative for headaches, focal weakness or numbness.    ____________________________________________   PHYSICAL EXAM:  VITAL SIGNS: ED Triage Vitals [01/13/18  1127]  Enc Vitals Group     BP (!) 147/90     Pulse Rate (!) 109     Resp 18     Temp 98 F (36.7 C)     Temp Source Oral     SpO2 100 %     Weight 115 lb (52.2 kg)     Height 5\' 3"  (1.6 m)     Head Circumference      Peak Flow      Pain Score 0     Pain Loc      Pain Edu?      Excl. in GC?     Constitutional: Alert and oriented. Well appearing and in no acute distress.  He does appear to slightly  anxious, reports she is anxious about being here but not had any active symptoms now. Eyes: Conjunctivae are normal. Head: Atraumatic. Nose: No congestion/rhinnorhea. Mouth/Throat: Mucous membranes are moist. Neck: No stridor.   Cardiovascular: Normal rate, regular rhythm. Grossly normal heart sounds.  Good peripheral circulation. Respiratory: Normal respiratory effort.  No retractions. Lungs CTAB. Gastrointestinal: Soft and nontender. No distention. Musculoskeletal: No lower extremity tenderness nor edema. Neurologic:  Normal speech and language. No gross focal neurologic deficits are appreciated.  Skin:  Skin is warm, dry and intact. No rash noted. Psychiatric: Mood and affect are normal. Speech and behavior are normal.  ____________________________________________   LABS (all labs ordered are listed, but only abnormal results are displayed)  Labs Reviewed  BASIC METABOLIC PANEL - Abnormal; Notable for the following components:      Result Value   Glucose, Bld 134 (*)    All other components within normal limits  URINALYSIS, COMPLETE (UACMP) WITH MICROSCOPIC - Abnormal; Notable for the following components:   Color, Urine YELLOW (*)    APPearance HAZY (*)    Ketones, ur 5 (*)    Bacteria, UA FEW (*)    All other components within normal limits  CBC  MAGNESIUM  METANEPHRINES, URINE, 24 HOUR  TSH  T4, FREE  CBG MONITORING, ED  POC URINE PREG, ED  POCT PREGNANCY, URINE   ____________________________________________  EKG Reviewed enterotomy at 11:30 AM Heart rate 105 QRS 80 QTc 600 Nonspecific but abnormal inferior T waves.  Nonspecific slurring noted on multiple T waves on her EKG. There is notable prolongation of the QT.  No ST elevation.  No Brugada.  Positive for prolonged QT  ____________________________________________  RADIOLOGY  Reviewed the patient's previous ER chart.  Patient also had a CT angiogram at that time that did not demonstrate a pulmonary  embolism.  ____________________________________________   PROCEDURES  Procedure(s) performed: None  Procedures  Critical Care performed: No  ____________________________________________   INITIAL IMPRESSION / ASSESSMENT AND PLAN / ED COURSE  Pertinent labs & imaging results that were available during my care of the patient were reviewed by me and considered in my medical decision making (see chart for details).  Patient presents for ongoing intermittent episodes of nausea vomiting fatigue and near syncopal feelings.  Today on presentation her EKG is markedly abnormal, notably a very long QT and nonspecific T wave abnormalities.  Her potassium and magnesium are normal.  She is stopped using caffeinated products, reports she only took 1 tablet of Zofran about 2 weeks ago and she is not currently on any obviously QT prolonging medications.  Denies use of any synthetic marijuana as her other drugs of abuse.    Based on her abnormal EKG, we will admit the  patient for cardiology consult and further monitoring on telemetry.  I am concerned regarding her palpitations/near syncopal symptoms in the setting of an abnormal twelve-lead.  Etiology is not clear, but I discussed case with Dr. Carney Harder, if cardiology recommends admission,      ____________________________________________   FINAL CLINICAL IMPRESSION(S) / ED DIAGNOSES  Final diagnoses:  Abnormal EKG  Prolonged Q-T interval on ECG  Syncope, unspecified syncope type      NEW MEDICATIONS STARTED DURING THIS VISIT:  New Prescriptions   No medications on file     Note:  This document was prepared using Dragon voice recognition software and may include unintentional dictation errors.     Sharyn Creamer, MD 01/13/18 1300

## 2018-01-13 NOTE — ED Notes (Signed)
First Nurse Note: Pt to ED c/o near syncopal episode last night. States was seen a few weeks ago for same symptoms. Pt in NAD at this time.

## 2018-01-13 NOTE — ED Notes (Signed)
Lab will add-on UDS to urine already collected from pt in triage.

## 2018-01-13 NOTE — ED Triage Notes (Signed)
Pt arrives to ED today for weakness, shaky, and fatigue. Feels like she is going to pass out. States eating and drinking well, stopped drinking soda. Denies LOC since being seen last time. Alert, oriented, ambulatory at this time. Denies hx of anemia. Denies DM.   States few weeks ago had syncopal episode x 2 and was brought by EMS.

## 2018-01-13 NOTE — H&P (Signed)
[employer] [facility] [place] [facility]   PATIENT NAME: Tara FinnerKristina Mccullough    MR#:  [id number]  DATE OF BIRTH:  1985-11-27  DATE OF ADMISSION:  01/13/2018  PRIMARY CARE PHYSICIAN: Patient, No Pcp Per   REQUESTING/REFERRING PHYSICIAN:  Dr. Sharyn CreamerMark Quale  CHIEF COMPLAINT:   Chief Complaint  Patient presents with  . Weakness    HISTORY OF PRESENT ILLNESS:  Tara Mccullough  is a 32 y.o. female with a known history of depression and anxiety, not in any medications, ongoing smoking presents to hospital secondary to recurrent syncopal episodes. Patient denies having any cardiac issues in the past.  Not under a lot of stress recently.  Has been using the same birth control pills for almost a year now.  She has had side effects to previous birth control pills.  3 weeks ago she started having recurrent syncopal episodes.  She feels the aura with nausea lightheadedness and dizziness and blurred vision and then she passes out for a few seconds.  After 2 of these episodes happening back to back she presented to the emergency room.  Blood work at that time was completely normal.  EKG was normal.  She was discharged on Zofran for nausea.  She has taken only 1 tablet of that.  Last night she was laying in her bed suddenly felt the aura and had a near syncopal episode.  This morning when she woke up she felt weak and lightheaded and so presented to the emergency room.  EKG this time showing a QTC of 650, and diffuse ST depressions in lateral and inferior leads.  No other new medications.  Denies any drug use other than smoking weed. -On-call cardiology recommended admission on telemetry and echocardiogram.  PAST MEDICAL HISTORY:   Past Medical History:  Diagnosis Date  . Anxiety   . Depression   . Ovarian cyst   . Painful menstrual periods   . PTSD (post-traumatic stress disorder)   . Tobacco user   . Uses marijuana    last used 06/16/2015- previously used daily    PAST SURGICAL  HISTORY:   Past Surgical History:  Procedure Laterality Date  . DILATION AND EVACUATION N/A 08/02/2015   Procedure: DILATATION AND EVACUATION;  Surgeon: Herold HarmsMartin A Defrancesco, MD;  Location: [facility] ORS;  Service: Gynecology;  Laterality: N/A;  . WISDOM TOOTH EXTRACTION      SOCIAL HISTORY:   Social History   Tobacco Use  . Smoking status: Current Every Day Smoker    Packs/day: 0.50    Types: Cigarettes  . Smokeless tobacco: Never Used  Substance Use Topics  . Alcohol use: Yes    Comment: occas    FAMILY HISTORY:   Family History  Problem Relation Age of Onset  . Heart disease Father   . Diabetes Maternal Aunt   . Heart disease Maternal Grandmother   . Diabetes Maternal Grandfather   . Heart disease Paternal Grandmother   . Cancer Neg Hx     DRUG ALLERGIES:  No Known Allergies  REVIEW OF SYSTEMS:   Review of Systems  Constitutional: Positive for malaise/fatigue. Negative for chills, fever and weight loss.  HENT: Negative for ear discharge, ear pain, hearing loss, nosebleeds and tinnitus.   Eyes: Positive for blurred vision. Negative for double vision and photophobia.  Respiratory: Negative for cough, hemoptysis, shortness of breath and wheezing.   Cardiovascular: Negative for chest pain, palpitations, orthopnea and leg swelling.  Gastrointestinal: Positive for nausea. Negative for abdominal pain, constipation,  diarrhea, heartburn, melena and vomiting.  Genitourinary: Negative for dysuria, frequency, hematuria and urgency.  Musculoskeletal: Negative for back pain, myalgias and neck pain.  Skin: Negative for rash.  Neurological: Positive for dizziness. Negative for tingling, tremors, sensory change, speech change, focal weakness and headaches.  Endo/Heme/Allergies: Does not bruise/bleed easily.  Psychiatric/Behavioral: Negative for depression.    MEDICATIONS AT HOME:   Prior to Admission medications   Medication Sig Start Date End Date Taking? Authorizing Provider    levonorgestrel-ethinyl estradiol (LYBREL,AMETHYST) 90-20 MCG tablet Take 1 tablet by mouth daily. 11/26/17  Yes Shambley, Melody N, CNM  ondansetron (ZOFRAN) 4 MG tablet Take 1 tablet (4 mg total) by mouth daily as needed. 12/23/17  Yes Schaevitz, Myra Rude, MD  dicyclomine (BENTYL) 20 MG tablet Take 1 tablet (20 mg total) by mouth 3 (three) times daily as needed for spasms. Patient not taking: Reported on 01/13/2018 12/23/17 12/23/18  Myrna Blazer, MD      VITAL SIGNS:  Blood pressure (!) 132/94, pulse 92, temperature 98 F (36.7 C), temperature source Oral, resp. rate (!) 21, height 5\' 3"  (1.6 m), weight 52.2 kg (115 lb), SpO2 98 %.  PHYSICAL EXAMINATION:   Physical Exam  GENERAL:  32 y.o.-year-old patient lying in the bed with no acute distress.  EYES: Pupils equal, round, reactive to light and accommodation. No scleral icterus. Extraocular muscles intact.  HEENT: Head atraumatic, normocephalic. Oropharynx and nasopharynx clear.  NECK:  Supple, no jugular venous distention. No thyroid enlargement, no tenderness.  LUNGS: Normal breath sounds bilaterally, no wheezing, rales,rhonchi or crepitation. No use of accessory muscles of respiration.  CARDIOVASCULAR: S1, S2 normal. No murmurs, rubs, or gallops.  ABDOMEN: Soft, nontender, nondistended. Bowel sounds present. No organomegaly or mass.  EXTREMITIES: No pedal edema, cyanosis, or clubbing.  NEUROLOGIC: Cranial nerves II through XII are intact. Muscle strength 5/5 in all extremities. Sensation intact. Gait not checked.  PSYCHIATRIC: The patient is alert and oriented x 3.  SKIN: No obvious rash, lesion, or ulcer.   LABORATORY PANEL:   CBC Recent Labs  Lab 01/13/18 1129  WBC 9.1  HGB 15.7  HCT 45.3  PLT 302   ------------------------------------------------------------------------------------------------------------------  Chemistries  Recent Labs  Lab 01/13/18 1129  NA 140  K 3.7  CL 108  CO2 22  GLUCOSE 134*   BUN 6  CREATININE 0.59  CALCIUM 9.6  MG 2.3   ------------------------------------------------------------------------------------------------------------------  Cardiac Enzymes No results for input(s): TROPONINI in the last 168 hours. ------------------------------------------------------------------------------------------------------------------  RADIOLOGY:  No results found.  EKG:   Orders placed or performed during the hospital encounter of 01/13/18  . ED EKG  . ED EKG  . EKG 12-Lead  . EKG 12-Lead    IMPRESSION AND PLAN:   Kahley Leib  is a 32 y.o. female with a known history of depression and anxiety, not in any medications, ongoing smoking presents to hospital secondary to recurrent syncopal episodes.  1.  Recurrent syncope-concern for cardiogenic etiology -Similar presentation 2 weeks ago, CT head was completely normal and CT chest negative for PE -Now with diffuse EKG changes and prolonged QT -Check urine tox screen.  Hold her birth control pills -Monitor on telemetry as observation. -Cardiology consult.  Echocardiogram and carotid Dopplers ordered. -No evidence of any seizure-like activity, hold off on MRI brain at this time. -Urine metanephrines ordered as initial blood pressure was slightly elevated.  2.  Hypokalemia-being replaced.  Keep potassium greater than 4.  Magnesium is within normal limits -TSH is pending  3.  Tobacco use disorder-counseled, refused nicotine patch  4.  DVT prophylaxis-Lovenox    All the records are reviewed and case discussed with ED provider. Management plans discussed with the patient, family and they are in agreement.  CODE STATUS: Full Code  TOTAL TIME TAKING CARE OF THIS PATIENT: 50 minutes.    Micahel Omlor M.D on 01/13/2018 at 1:14 PM  Between 7am to 6pm - Pager - [phone]  After 6pm go to [email or web address] - password [employer]  [facility]  Office  [phone]  CC: Primary care  physician; Patient, No Pcp Per

## 2018-01-13 NOTE — ED Notes (Signed)
Pt being transported to rm [id number] at this time by this tech.

## 2018-01-14 ENCOUNTER — Other Ambulatory Visit (INDEPENDENT_AMBULATORY_CARE_PROVIDER_SITE_OTHER): Payer: Self-pay

## 2018-01-14 ENCOUNTER — Telehealth: Payer: Self-pay | Admitting: Physician Assistant

## 2018-01-14 ENCOUNTER — Observation Stay (HOSPITAL_BASED_OUTPATIENT_CLINIC_OR_DEPARTMENT_OTHER)
Admit: 2018-01-14 | Discharge: 2018-01-14 | Disposition: A | Discharge status: Home / Self Care | Payer: Self-pay | Attending: Internal Medicine | Admitting: Internal Medicine

## 2018-01-14 DIAGNOSIS — R55 Syncope and collapse: Secondary | ICD-10-CM

## 2018-01-14 LAB — CBC
HCT: 38.2 % (ref 35.0–47.0)
Hemoglobin: 13.5 g/dL (ref 12.0–16.0)
MCH: 33.4 pg (ref 26.0–34.0)
MCHC: 35.3 g/dL (ref 32.0–36.0)
MCV: 94.5 fL (ref 80.0–100.0)
PLATELETS: 245 10*3/uL (ref 150–440)
RBC: 4.04 MIL/uL (ref 3.80–5.20)
RDW: 12.8 % (ref 11.5–14.5)
WBC: 6.9 10*3/uL (ref 3.6–11.0)

## 2018-01-14 LAB — ECHOCARDIOGRAM COMPLETE
HEIGHTINCHES: 63 in
WEIGHTICAEL: 1840 [oz_av]

## 2018-01-14 LAB — BASIC METABOLIC PANEL
Anion gap: 6 (ref 5–15)
BUN: <5 mg/dL — ABNORMAL LOW (ref 6–20)
CALCIUM: 8.6 mg/dL — AB (ref 8.9–10.3)
CO2: 24 mmol/L (ref 22–32)
CREATININE: 0.61 mg/dL (ref 0.44–1.00)
Chloride: 109 mmol/L (ref 98–111)
GFR calc Af Amer: >60 mL/min (ref >60)
GLUCOSE: 86 mg/dL (ref 70–99)
Potassium: 4.3 mmol/L (ref 3.5–5.1)
Sodium: 139 mmol/L (ref 135–145)

## 2018-01-14 MED ORDER — PROPRANOLOL HCL 10 MG PO TABS: 10.0000 mg | ORAL_TABLET | Freq: Two times a day (BID) | ORAL | 0 refills | Status: DC

## 2018-01-14 NOTE — Progress Notes (Signed)
*  PRELIMINARY RESULTS* Echocardiogram 2D Echocardiogram has been performed.  Joanette GulaJoan M Rollie Hynek 01/14/2018, 10:02 AM

## 2018-01-14 NOTE — Care Management (Signed)
CM informed that patient to discharge on Propanolol.  Provided patient with Good Rx coupon and patient relayed she would be able to pay for the medication.  CM later informed that patient is not going to discharge on this medication after all.

## 2018-01-14 NOTE — Progress Notes (Signed)
Progress Note  Patient Name: Tara Mccullough Date of Encounter: 01/14/2018  Primary Cardiologist: No primary care provider on file.   Subjective   Patient denies further pre-syncopal symptoms admission. Specifically, she denies further episodes of nausea, diaphoresis, weakness, and feeling as if her heart is racing. She states she is sleeping and eating well, as well as staying well hydrated. She denies past and current chest pain, tightness, and pressure. She also denies current and past palpitations or difficulty breathing.   Both the patient and her fiance express concern that the pre-syncopal symptoms are related to the patient's marijuana use, stating that the symptoms usually occur 30 - 60 minutes following use. The patient also states her desire to stop smoking marijuana given this recent hospitalization.  She indicates this most recent episode occurred following "severe vomiting."  Patient's BP improved from 147/90 (01/13/18) --> 132/78 (01/14/18); HR 109 bpm (01/13/18)   --> 75bpm (7/1), on propranolol.  EKG 01/13/18 showed prolonged QTc (650) --> today (01/14/18) QTc is 427.  Labs significant for (7/1) Ca 8.6. Metanephrines lab pending results.  7/1: B/l Carotid US - no visible plaque.   7/1: Echo (as below) - normal study. 7/1: EKG NSR, 77bpm.   Inpatient Medications    Scheduled Meds: . enoxaparin (LOVENOX) injection  40 mg Subcutaneous Q24H  . nicotine  14 mg Transdermal Daily  . potassium chloride  20 mEq Oral BID  . propranolol  10 mg Oral BID   Continuous Infusions:  PRN Meds: acetaminophen **OR** acetaminophen, promethazine **OR** promethazine   Vital Signs    Vitals:   01/13/18 2042 01/14/18 0307 01/14/18 0855 01/14/18 1330  BP: 133/85 132/82 132/78   Pulse: 73 74 75   Resp:  17 18   Temp:  98.4 F (36.9 C) 98.9 F (37.2 C)   TempSrc:  Oral Oral   SpO2: 97% 100% 99%   Weight:    121 lb 1.6 oz (54.9 kg)  Height:        Intake/Output Summary (Last 24  hours) at 01/14/2018 1643 Last data filed at 01/14/2018 1440 Gross per 24 hour  Intake 73.75 ml  Output 2000 ml  Net -1926.25 ml   Filed Weights   01/13/18 1127 01/14/18 1330  Weight: 115 lb (52.2 kg) 121 lb 1.6 oz (54.9 kg)    Telemetry    NSR, 70-75 bpm - Personally Reviewed  ECG    7/1  - NSR, 77bpm.Personally Reviewed  Physical Exam  GEN: No acute distress.  Sitting comfortably in bed next to fiance.  Neck: No JVD. No tenderness. Right-sided bruit.  Cardiac: RRR, no murmurs, rubs, or gallops.  Respiratory: Clear to auscultation bilaterally. No wheezing. GI: Soft, nontender, non-distended  MS: No edema bilaterally; No deformity.  Neuro:  Nonfocal. Alert and oriented.  Psych: Normal affect   Labs    Chemistry Recent Labs  Lab 01/13/18 1129 01/14/18 0548  NA 140 139  K 3.7 4.3  CL 108 109  CO2 22 24  GLUCOSE 134* 86  BUN 6 <5*  CREATININE 0.59 0.61  CALCIUM 9.6 8.6*  PROT 7.7  --   ALBUMIN 4.6  --   AST 27  --   ALT 22  --   ALKPHOS 54  --   BILITOT 0.5  --   GFRNONAA >60 >60  GFRAA >60 >60  ANIONGAP 10 6     Hematology Recent Labs  Lab 01/13/18 1129 01/14/18 0548  WBC 9.1 6.9  RBC 4.79 4.04  HGB 15.7 13.5  HCT 45.3 38.2  MCV 94.5 94.5  MCH 32.7 33.4  MCHC 34.6 35.3  RDW 12.5 12.8  PLT 302 245    Cardiac EnzymesNo results for input(s): TROPONINI in the last 168 hours. No results for input(s): TROPIPOC in the last 168 hours.   BNPNo results for input(s): BNP, PROBNP in the last 168 hours.   DDimer No results for input(s): DDIMER in the last 168 hours.   Radiology    [place] Carotid Bilateral  Result Date: 01/13/2018 IMPRESSION: Normal carotid Doppler. Electronically Signed   By: Marnee SpringJonathon  Watts M.D.   On: 01/13/2018 15:08    Cardiac Studies   Transthoracic Echo 01/14/18 -Study Conclusions- Left ventricle: The cavity size was normal. Wall thickness was   normal. Systolic function was normal. The estimated ejection   fraction was in the  range of 55% to 60%. Wall motion was normal;   there were no regional wall motion abnormalities. Left   ventricular diastolic function parameters were normal. -Impressions: Normal study.   Patient Profile     32 y.o. female with PMH significant for marijuana use (01/13/18 tox screen + for [place]), tobacco use, ovarian cyst, depression and anxiety who is being seen today for reported symptoms of pre-syncope including weakness, nausea, diaphoresis, and feelings of rapid heart rate. Patient with reported past syncopal episodes on 12/23/17.  Assessment & Plan    1. Pre-syncope symptoms -Past reported syncopal episode 12/23/17 and discharged 12/24/17 with Zofran and Bentyl for LUQ abdominal pain, nausea, vomiting, and syncope. She took bentyl x 1 without improvement and denies picking up Zofran (never took).  -No active chest pain. No pre-syncopal episodes since admission on 01/13/18.  -7/1 EKG with QTc improved from 650 (6/30) --> 427. -Recommend holter monitor - patient reports racing heart rate, nausea, and and weakness 30-60 minutes following marijuana use. Monitor will reveal if marijuana use precipitates any arrhythmias. Zio placed prior to discharge.  -Discharge with propanolol due to feelings of rapid heart rate and anxiety.  -Consider outpatient stress test if results of holter monitor inconclusive.    For questions or updates, please contact [facility] Please consult [email or web address] for contact info under Cardiology/STEMI.      Signed, Lennon AlstromJacquelyn D Visser, PA-C  01/14/2018, 4:43 PM     Patient discussed with PA-C and rounded on with changes made as needed. MD to staff patient as well.   Eula Listenyan Abdulkareem Badolato, PA-C

## 2018-01-14 NOTE — Telephone Encounter (Signed)
Call received from Eula Listenyan Dunn, PA that this patient is currently an inpatient with plans for discharge today.  She needs to have a ZIO placed prior to discharge for syncope.  I did go to the patient's room and place a 14 day ZIO on her.  ID #- [id number]

## 2018-01-14 NOTE — Progress Notes (Signed)
Nutrition Brief Note  Patient identified on the Malnutrition Screening Tool (MST) Report  32 y/o female admitted with syncope  Met with pt in room today. Pt reports good appetite and oral intake pta and today. Pt reports that she is not a very big breakfast eater and normally drinks chocolate carnation instant breakfast with 2% milk in the mornings. Per chart, pt appears wt stable.   Wt Readings from Last 15 Encounters:  01/14/18 121 lb 1.6 oz (54.9 kg)  03/13/17 120 lb 1.6 oz (54.5 kg)  02/23/17 119 lb 8 oz (54.2 kg)  03/02/16 122 lb 3.2 oz (55.4 kg)  01/20/16 119 lb 6.4 oz (54.2 kg)  11/01/15 112 lb 14.4 oz (51.2 kg)  08/16/15 109 lb 6 oz (49.6 kg)  08/12/15 110 lb (49.9 kg)  08/02/15 109 lb (49.4 kg)  07/29/15 109 lb (49.4 kg)  07/27/15 109 lb (49.4 kg)  07/20/15 108 lb 6 oz (49.2 kg)  07/13/15 107 lb 11.2 oz (48.9 kg)    Body mass index is 21.45 kg/m. Patient meets criteria for normal based on current BMI.   Current diet order is regular, patient is consuming approximately 75% of meals at this time. Labs and medications reviewed.   No nutrition interventions warranted at this time. If nutrition issues arise, please consult RD.   Koleen Distance MS, RD, LDN Pager #- [phone] Office#- [phone] After Hours Pager: [phone]

## 2018-01-14 NOTE — Plan of Care (Signed)

## 2018-01-14 NOTE — Progress Notes (Signed)
Patient given discharge instructions with boyfriend at bedside. Instructed that cardiology office will call her to set up a follow up appointment. IV taken out and tele monitor off. ZIO placed. Patient verbalized understanding with no further questions. Patient transported home via family vehicle.

## 2018-01-14 NOTE — Progress Notes (Addendum)
Patient alert and oriented x4. Patient refusing bed alarm, has been independent in the room with no complications. NO complaints of pain or nausea/vomiting. Patient's boyfriend in the room assisting. Will continue to monitor patient.   Update: patient ambulated with no issues or complaints. No chest pain, nausea/vomiting or shortness of breath.

## 2018-01-15 LAB — HIV ANTIBODY (ROUTINE TESTING W REFLEX): HIV Screen 4th Generation wRfx: NONREACTIVE

## 2018-01-16 LAB — METANEPHRINES, URINE, 24 HOUR
Metaneph Total, Ur: 62 ug/L
Metanephrines, 24H Ur: 130 ug/24 hr (ref 45–290)
Normetanephrine, 24H Ur: 181 ug/24 hr (ref 82–500)
Normetanephrine, Ur: 86 ug/L
Total Volume: 2100

## 2018-01-16 NOTE — Discharge Summary (Signed)
[facility] -  at [facility]  Charlsey Helbig, [place]32 y.o., DOB 07/28/1985, MRN [id number]. Admission date: 01/13/2018 Discharge Date 01/16/2018 Primary MD Patient, No Pcp Per Admitting Physician Enid Baasadhika Kalisetti, MD  Admission Diagnosis  Syncope [R55] Prolonged Q-T interval on ECG [R94.31] Abnormal EKG [R94.31] Syncope, unspecified syncope type [R55]  Discharge Diagnosis   Active Problems:  Recurrent syncope etiology unclear Prolonged QT History anxiety depression PTSD      Hospital Course  Tara Mccullough  is a 32 y.o. female with a known history of depression and anxiety, not in any medications, ongoing smoking presents to hospital secondary to recurrent syncopal episodes.  Patient initial EKG did show some prolonged QT which was likely due to medications.  Her EKG normalized next day.  She was seen by cardiology had a echocardiogram of the heart which was negative.  She will have a Holter monitor placed for further evaluation.  She is doing much better and stable for discharge with outpatient cardiology follow-up.              Consults  cardiology  Significant Tests:  See full reports for all details     Dg Chest 2 View  Result Date: 12/23/2017 CLINICAL DATA:  Left lower chest pain with dyspnea. EXAM: CHEST - 2 VIEW COMPARISON:  None. FINDINGS: The heart size and mediastinal contours are within normal limits. Both lungs are clear. The visualized skeletal structures are unremarkable. IMPRESSION: No active cardiopulmonary disease. Electronically Signed   By: Tollie Ethavid  Kwon M.D.   On: 12/23/2017 22:25   Ct Head Wo Contrast  Result Date: 12/23/2017 CLINICAL DATA:  Seizure versus syncope.  New onset, nontraumatic. EXAM: CT HEAD WITHOUT CONTRAST TECHNIQUE: Contiguous axial images were obtained from the base of the skull through the vertex without intravenous contrast. COMPARISON:  None. FINDINGS: Brain: No intracranial hemorrhage, mass effect, or midline  shift. No hydrocephalus. The basilar cisterns are patent. No evidence of territorial infarct or acute ischemia. No extra-axial or intracranial fluid collection. Vascular: No hyperdense vessel or unexpected calcification. Skull: No fracture or focal lesion. Sinuses/Orbits: Paranasal sinuses and mastoid air cells are clear. The visualized orbits are unremarkable. Other: None. IMPRESSION: Unremarkable noncontrast head CT. Electronically Signed   By: Rubye OaksMelanie  Ehinger M.D.   On: 12/23/2017 22:18   Ct Angio Chest Pe W/cm &/or Wo Cm  Result Date: 12/24/2017 CLINICAL DATA:  Multiple syncopal episodes in the past hour. EXAM: CT ANGIOGRAPHY CHEST WITH CONTRAST TECHNIQUE: Multidetector CT imaging of the chest was performed using the standard protocol during bolus administration of intravenous contrast. Multiplanar CT image reconstructions and MIPs were obtained to evaluate the vascular anatomy. CONTRAST:  75mL ISOVUE-370 IOPAMIDOL (ISOVUE-370) INJECTION 76% COMPARISON:  None. FINDINGS: Cardiovascular: The study is of quality for the evaluation of pulmonary embolism. There are no filling defects in the central, lobar, segmental or subsegmental pulmonary artery branches to suggest acute pulmonary embolism. Great vessels are normal in course and caliber. Normal heart size. No significant pericardial fluid/thickening. Mediastinum/Nodes: No discrete thyroid nodules. Unremarkable esophagus. No pathologically enlarged axillary, mediastinal or hilar lymph nodes. Lungs/Pleura: No pneumothorax. No pleural effusion. Minimal biapical pleuroparenchymal scarring and/or atelectasis. Upper abdomen: Unremarkable. Musculoskeletal:  No aggressive appearing focal osseous lesions. Review of the MIP images confirms the above findings. IMPRESSION: Clear lungs without evidence of acute pulmonary embolus. Electronically Signed   By: Tollie Ethavid  Kwon M.D.   On: 12/24/2017 01:58   [place] Carotid Bilateral  Result Date: 01/13/2018 CLINICAL DATA:  Syncope  EXAM: BILATERAL  CAROTID DUPLEX ULTRASOUND TECHNIQUE: Wallace Cullens scale imaging, color Doppler and duplex ultrasound were performed of bilateral carotid and vertebral arteries in the neck. COMPARISON:  Head CT 12/23/2017 FINDINGS: Criteria: Quantification of carotid stenosis is based on velocity parameters that correlate the residual internal carotid diameter with NASCET-based stenosis levels, using the diameter of the distal internal carotid lumen as the denominator for stenosis measurement. The following velocity measurements were obtained: RIGHT ICA: 155/27 cm/sec CCA: 144/43 cm/sec SYSTOLIC ICA/CCA RATIO: 1.1 ECA:  108 cm/sec LEFT ICA: 136/44 cm/sec CCA: 164/40 cm/sec SYSTOLIC ICA/CCA RATIO: 0.8 ECA:  132 cm/sec RIGHT CAROTID ARTERY: Negative.  No visible plaque. RIGHT VERTEBRAL ARTERY:  Patent and antegrade. LEFT CAROTID ARTERY:  Negative.  No visible plaque. LEFT VERTEBRAL ARTERY:  Patent and antegrade. IMPRESSION: Normal carotid Doppler. Electronically Signed   By: Marnee Spring M.D.   On: 01/13/2018 15:08       Today   Subjective:   Tara Mccullough feeling well denies any complaints Objective:   Blood pressure 132/78, pulse 75, temperature 98.9 F (37.2 C), temperature source Oral, resp. rate 18, height 5\' 3"  (1.6 m), weight 54.9 kg (121 lb 1.6 oz), SpO2 99 %.  . No intake or output data in the 24 hours ending 01/16/18 1454  Exam VITAL SIGNS: Blood pressure 132/78, pulse 75, temperature 98.9 F (37.2 C), temperature source Oral, resp. rate 18, height 5\' 3"  (1.6 m), weight 54.9 kg (121 lb 1.6 oz), SpO2 99 %.  GENERAL:  32 y.o.-year-old patient lying in the bed with no acute distress.  EYES: Pupils equal, round, reactive to light and accommodation. No scleral icterus. Extraocular muscles intact.  HEENT: Head atraumatic, normocephalic. Oropharynx and nasopharynx clear.  NECK:  Supple, no jugular venous distention. No thyroid enlargement, no tenderness.  LUNGS: Normal breath sounds  bilaterally, no wheezing, rales,rhonchi or crepitation. No use of accessory muscles of respiration.  CARDIOVASCULAR: S1, S2 normal. No murmurs, rubs, or gallops.  ABDOMEN: Soft, nontender, nondistended. Bowel sounds present. No organomegaly or mass.  EXTREMITIES: No pedal edema, cyanosis, or clubbing.  NEUROLOGIC: Cranial nerves II through XII are intact. Muscle strength 5/5 in all extremities. Sensation intact. Gait not checked.  PSYCHIATRIC: The patient is alert and oriented x 3.  SKIN: No obvious rash, lesion, or ulcer.   Data Review     CBC w Diff:  Lab Results  Component Value Date   WBC 6.9 01/14/2018   HGB 13.5 01/14/2018   HGB 13.8 07/13/2015   HCT 38.2 01/14/2018   HCT 40.6 07/13/2015   PLT 245 01/14/2018   PLT 291 07/13/2015   LYMPHOPCT 39 12/23/2017   MONOPCT 7 12/23/2017   EOSPCT 1 12/23/2017   BASOPCT 0 12/23/2017   CMP:  Lab Results  Component Value Date   NA 139 01/14/2018   K 4.3 01/14/2018   CL 109 01/14/2018   CO2 24 01/14/2018   BUN <5 (L) 01/14/2018   CREATININE 0.61 01/14/2018   PROT 7.7 01/13/2018   ALBUMIN 4.6 01/13/2018   BILITOT 0.5 01/13/2018   ALKPHOS 54 01/13/2018   AST 27 01/13/2018   ALT 22 01/13/2018  .  Micro Results No results found for this or any previous visit (from the past 240 hour(s)).   Code Status History    Date Active Date Inactive Code Status Order ID Comments User Context   01/13/2018 1506 01/14/2018 2022 Full Code [id number]  Enid Baas, MD ED          Follow-up Information  Sondra Barges, PA-C Follow up.   Specialties:  Physician Assistant, Cardiology, Radiology Contact information: [address] [place] [address] [phone]           Discharge Medications   Allergies as of 01/14/2018   No Known Allergies     Medication List    TAKE these medications   dicyclomine 20 MG tablet Commonly known as:  BENTYL Take 1 tablet (20 mg total) by mouth 3 (three) times daily as  needed for spasms.   levonorgestrel-ethinyl estradiol 90-20 MCG tablet Commonly known as:  LYBREL,AMETHYST Take 1 tablet by mouth daily.   ondansetron 4 MG tablet Commonly known as:  ZOFRAN Take 1 tablet (4 mg total) by mouth daily as needed.          Total Time in preparing paper work, data evaluation and todays exam - 35 minutes  Auburn Bilberry M.D on 01/16/2018 at 2:54 PM [facility]   Office  [phone]

## 2018-01-23 ENCOUNTER — Encounter: Payer: Self-pay | Admitting: Obstetrics and Gynecology

## 2018-02-15 ENCOUNTER — Telehealth: Payer: Self-pay | Admitting: Physician Assistant

## 2018-02-15 NOTE — Telephone Encounter (Signed)
-----   Message from Bryna ColanderPamela S Allen, RN sent at 02/13/2018  3:30 PM EDT ----- Reviewed results and recommendations with patient and she verbalized understanding with no further questions at this time. Advised that I would have scheduling give her a call to set up appointment. She was agreeable with this plan and had no further questions.

## 2018-02-15 NOTE — Telephone Encounter (Signed)
lmov to call office for an appt. °

## 2018-02-20 NOTE — Telephone Encounter (Signed)
Patient is scheduled 03/29/18

## 2018-03-15 ENCOUNTER — Encounter: Payer: Self-pay | Admitting: Obstetrics and Gynecology

## 2018-03-15 ENCOUNTER — Other Ambulatory Visit (HOSPITAL_COMMUNITY)
Admission: RE | Admit: 2018-03-15 | Discharge: 2018-03-15 | Disposition: A | Discharge status: Home / Self Care | Payer: Medicaid Other | Source: Ambulatory Visit | Attending: Obstetrics and Gynecology | Admitting: Obstetrics and Gynecology

## 2018-03-15 ENCOUNTER — Ambulatory Visit (INDEPENDENT_AMBULATORY_CARE_PROVIDER_SITE_OTHER): Payer: Medicaid Other | Admitting: Obstetrics and Gynecology

## 2018-03-15 VITALS — BP 124/82 | HR 100 | Ht 63.0 in | Wt 110.0 lb

## 2018-03-15 DIAGNOSIS — Z01419 Encounter for gynecological examination (general) (routine) without abnormal findings: Secondary | ICD-10-CM

## 2018-03-15 DIAGNOSIS — Z1272 Encounter for screening for malignant neoplasm of vagina: Secondary | ICD-10-CM | POA: Diagnosis not present

## 2018-03-15 DIAGNOSIS — Z309 Encounter for contraceptive management, unspecified: Secondary | ICD-10-CM

## 2018-03-15 MED ORDER — LEVONORGESTREL-ETHINYL ESTRAD 90-20 MCG PO TABS: 1.0000 | ORAL_TABLET | Freq: Every day | ORAL | 4 refills | Status: DC

## 2018-03-15 NOTE — Progress Notes (Signed)
Subjective:   Tara Mccullough is a 32 y.o. 711P0010 Caucasian female here for a routine well-woman exam.  No LMP recorded. (Menstrual status: Oral contraceptives).    Current complaints: has developed darker skin happy with pillsn over lip, occasional spotting with current pills, otherwise  Has recently been diagnosed with stomach ulcer. 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 Tobacco/alcohol: no alcohol use; smoking 1/2ppd now which is less than before.  Illicit drugs: no history of illicit drug use  The following portions of the patient's history were reviewed and updated as appropriate: allergies, current medications, past family history, past medical history, past social history, past surgical history and problem list.  Past Medical History Past Medical History:  Diagnosis Date  . Anxiety   . Depression   . Ovarian cyst   . Painful menstrual periods   . PTSD (post-traumatic stress disorder)   . Tobacco user   . Uses marijuana    last used 06/16/2015- previously used daily    Past Surgical History Past Surgical History:  Procedure Laterality Date  . DILATION AND EVACUATION N/A 08/02/2015   Procedure: DILATATION AND EVACUATION;  Surgeon: Herold HarmsMartin A Defrancesco, MD;  Location: [facility] ORS;  Service: Gynecology;  Laterality: N/A;  . WISDOM TOOTH EXTRACTION      Gynecologic History G1P0010  No LMP recorded. (Menstrual status: Oral contraceptives). Contraception: OCP (estrogen/progesterone) Last Pap: 02/2016. Results were: normal   Obstetric History OB History  Gravida Para Term Preterm AB Living  1       1    SAB TAB Ectopic Multiple Live Births  1            # Outcome Date GA Lbr Len/2nd Weight Sex Delivery Anes PTL Lv  1 SAB 08/12/15 [id number]       FD    Current Medications Current Outpatient Medications on File Prior to  Visit  Medication Sig Dispense Refill  . levonorgestrel-ethinyl estradiol (LYBREL,AMETHYST) 90-20 MCG tablet Take 1 tablet by mouth daily. 28 tablet 3  . pantoprazole (PROTONIX) 40 MG tablet Take 40 mg by mouth daily.    . ranitidine (ZANTAC) 150 MG/10ML syrup Take by mouth 2 (two) times daily.    Marland Kitchen. dicyclomine (BENTYL) 20 MG tablet Take 1 tablet (20 mg total) by mouth 3 (three) times daily as needed for spasms. (Patient not taking: Reported on 01/13/2018) 30 tablet 0  . ondansetron (ZOFRAN) 4 MG tablet Take 1 tablet (4 mg total) by mouth daily as needed. (Patient not taking: Reported on 03/15/2018) 10 tablet 0   No current facility-administered medications on file prior to visit.     Review of Systems Patient denies any headaches, blurred vision, shortness of breath, chest pain, abdominal pain, problems with bowel movements, urination, or intercourse.  Objective:  BP 124/82   Pulse 100   Ht 5\' 3"  (1.6 m)   Wt 110 lb (49.9 kg)   BMI 19.49 kg/m  Physical Exam  General:  Well developed, well nourished, no acute distress. She is alert and oriented x3. Skin:  Warm and dry Neck:  Midline trachea, no thyromegaly or nodules Cardiovascular: Regular rate and rhythm, no murmur heard Lungs:  Effort normal, all lung fields clear to auscultation bilaterally Breasts:  No dominant palpable mass, retraction, or nipple discharge Abdomen:  Soft, non tender, no hepatosplenomegaly or masses Pelvic:  External genitalia  is normal in appearance.  The vagina is normal in appearance. The cervix is bulbous, no CMT.  Thin prep pap is done with HR HPV cotesting. Uterus is felt to be normal size, shape, and contour.  No adnexal masses or tenderness noted. Extremities:  No swelling or varicosities noted Psych:  She has a normal mood and affect  Assessment:   Healthy well-woman exam Smoker OCP user  Plan:  Encouraged smoking cessation- she is actively working on cutting down and then quitting  F/U 1 year for  AE, or sooner if needed   Kiyara Bouffard Suzan Nailer, CNM

## 2018-03-15 NOTE — Patient Instructions (Signed)
Preventive Care 18-39 Years, Female Preventive care refers to lifestyle choices and visits with your health care provider that can promote health and wellness. What does preventive care include?  A yearly physical exam. This is also called an annual well check.  Dental exams once or twice a year.  Routine eye exams. Ask your health care provider how often you should have your eyes checked.  Personal lifestyle choices, including: ? Daily care of your teeth and gums. ? Regular physical activity. ? Eating a healthy diet. ? Avoiding tobacco and drug use. ? Limiting alcohol use. ? Practicing safe sex. ? Taking vitamin and mineral supplements as recommended by your health care provider. What happens during an annual well check? The services and screenings done by your health care provider during your annual well check will depend on your age, overall health, lifestyle risk factors, and family history of disease. Counseling Your health care provider may ask you questions about your:  Alcohol use.  Tobacco use.  Drug use.  Emotional well-being.  Home and relationship well-being.  Sexual activity.  Eating habits.  Work and work Statistician.  Method of birth control.  Menstrual cycle.  Pregnancy history.  Screening You may have the following tests or measurements:  Height, weight, and BMI.  Diabetes screening. This is done by checking your blood sugar (glucose) after you have not eaten for a while (fasting).  Blood pressure.  Lipid and cholesterol levels. These may be checked every 5 years starting at age 38.  Skin check.  Hepatitis C blood test.  Hepatitis B blood test.  Sexually transmitted disease (STD) testing.  BRCA-related cancer screening. This may be done if you have a family history of breast, ovarian, tubal, or peritoneal cancers.  Pelvic exam and Pap test. This may be done every 3 years starting at age 38. Starting at age 30, this may be done  every 5 years if you have a Pap test in combination with an HPV test.  Discuss your test results, treatment options, and if necessary, the need for more tests with your health care provider. Vaccines Your health care provider may recommend certain vaccines, such as:  Influenza vaccine. This is recommended every year.  Tetanus, diphtheria, and acellular pertussis (Tdap, Td) vaccine. You may need a Td booster every 10 years.  Varicella vaccine. You may need this if you have not been vaccinated.  HPV vaccine. If you are 39 or younger, you may need three doses over 6 months.  Measles, mumps, and rubella (MMR) vaccine. You may need at least one dose of MMR. You may also need a second dose.  Pneumococcal 13-valent conjugate (PCV13) vaccine. You may need this if you have certain conditions and were not previously vaccinated.  Pneumococcal polysaccharide (PPSV23) vaccine. You may need one or two doses if you smoke cigarettes or if you have certain conditions.  Meningococcal vaccine. One dose is recommended if you are age 68-21 years and a first-year college student living in a residence hall, or if you have one of several medical conditions. You may also need additional booster doses.  Hepatitis A vaccine. You may need this if you have certain conditions or if you travel or work in places where you may be exposed to hepatitis A.  Hepatitis B vaccine. You may need this if you have certain conditions or if you travel or work in places where you may be exposed to hepatitis B.  Haemophilus influenzae type b (Hib) vaccine. You may need this  if you have certain risk factors.  Talk to your health care provider about which screenings and vaccines you need and how often you need them. This information is not intended to replace advice given to you by your health care provider. Make sure you discuss any questions you have with your health care provider. Document Released: 08/29/2001 Document Revised:  03/22/2016 Document Reviewed: 05/04/2015 Elsevier Interactive Patient Education  2018 [employer] Inc.  

## 2018-03-20 LAB — CYTOLOGY - PAP
Adequacy: ABSENT
Diagnosis: NEGATIVE
HPV: NOT DETECTED

## 2018-03-29 ENCOUNTER — Ambulatory Visit: Payer: Medicaid Other | Admitting: Cardiovascular Disease

## 2018-06-12 ENCOUNTER — Emergency Department (HOSPITAL_COMMUNITY): Payer: No Typology Code available for payment source

## 2018-06-12 ENCOUNTER — Other Ambulatory Visit: Payer: Self-pay

## 2018-06-12 ENCOUNTER — Emergency Department (HOSPITAL_COMMUNITY)
Admission: EM | Admit: 2018-06-12 | Discharge: 2018-06-12 | Disposition: A | Discharge status: Home / Self Care | Payer: No Typology Code available for payment source | Attending: Emergency Medicine | Admitting: Emergency Medicine

## 2018-06-12 ENCOUNTER — Encounter (HOSPITAL_COMMUNITY): Payer: Self-pay | Admitting: Emergency Medicine

## 2018-06-12 DIAGNOSIS — Y9389 Activity, other specified: Secondary | ICD-10-CM | POA: Insufficient documentation

## 2018-06-12 DIAGNOSIS — Y92410 Unspecified street and highway as the place of occurrence of the external cause: Secondary | ICD-10-CM | POA: Diagnosis not present

## 2018-06-12 DIAGNOSIS — S301XXA Contusion of abdominal wall, initial encounter: Secondary | ICD-10-CM | POA: Diagnosis not present

## 2018-06-12 DIAGNOSIS — S3991XA Unspecified injury of abdomen, initial encounter: Secondary | ICD-10-CM | POA: Diagnosis present

## 2018-06-12 DIAGNOSIS — Z79899 Other long term (current) drug therapy: Secondary | ICD-10-CM | POA: Insufficient documentation

## 2018-06-12 DIAGNOSIS — Y999 Unspecified external cause status: Secondary | ICD-10-CM | POA: Insufficient documentation

## 2018-06-12 DIAGNOSIS — F17210 Nicotine dependence, cigarettes, uncomplicated: Secondary | ICD-10-CM | POA: Diagnosis not present

## 2018-06-12 LAB — CBC WITH DIFFERENTIAL/PLATELET
ABS IMMATURE GRANULOCYTES: 0.06 10*3/uL (ref 0.00–0.07)
Basophils Absolute: 0 10*3/uL (ref 0.0–0.1)
Basophils Relative: 0 %
EOS ABS: 0.1 10*3/uL (ref 0.0–0.5)
Eosinophils Relative: 1 %
HEMATOCRIT: 41.5 % (ref 36.0–46.0)
Hemoglobin: 13.7 g/dL (ref 12.0–15.0)
IMMATURE GRANULOCYTES: 0 %
LYMPHS ABS: 2.2 10*3/uL (ref 0.7–4.0)
Lymphocytes Relative: 16 %
MCH: 31.1 pg (ref 26.0–34.0)
MCHC: 33 g/dL (ref 30.0–36.0)
MCV: 94.3 fL (ref 80.0–100.0)
MONO ABS: 1 10*3/uL (ref 0.1–1.0)
MONOS PCT: 7 %
Neutro Abs: 10.6 10*3/uL — ABNORMAL HIGH (ref 1.7–7.7)
Neutrophils Relative %: 76 %
PLATELETS: 267 10*3/uL (ref 150–400)
RBC: 4.4 MIL/uL (ref 3.87–5.11)
RDW: 12.2 % (ref 11.5–15.5)
WBC: 14 10*3/uL — ABNORMAL HIGH (ref 4.0–10.5)
nRBC: 0 % (ref 0.0–0.2)

## 2018-06-12 LAB — URINALYSIS, ROUTINE W REFLEX MICROSCOPIC
Bilirubin Urine: NEGATIVE
Glucose, UA: NEGATIVE mg/dL
Ketones, ur: 5 mg/dL — AB
Leukocytes, UA: NEGATIVE
Nitrite: NEGATIVE
PH: 6 (ref 5.0–8.0)
PROTEIN: NEGATIVE mg/dL
SPECIFIC GRAVITY, URINE: 1.006 (ref 1.005–1.030)

## 2018-06-12 LAB — COMPREHENSIVE METABOLIC PANEL
ALT: 23 U/L (ref 0–44)
AST: 31 U/L (ref 15–41)
Albumin: 3.9 g/dL (ref 3.5–5.0)
Alkaline Phosphatase: 46 U/L (ref 38–126)
Anion gap: 10 (ref 5–15)
BILIRUBIN TOTAL: 0.1 mg/dL — AB (ref 0.3–1.2)
BUN: 7 mg/dL (ref 6–20)
CHLORIDE: 106 mmol/L (ref 98–111)
CO2: 20 mmol/L — ABNORMAL LOW (ref 22–32)
CREATININE: 0.72 mg/dL (ref 0.44–1.00)
Calcium: 9.1 mg/dL (ref 8.9–10.3)
Glucose, Bld: 101 mg/dL — ABNORMAL HIGH (ref 70–99)
Potassium: 3.5 mmol/L (ref 3.5–5.1)
Sodium: 136 mmol/L (ref 135–145)
TOTAL PROTEIN: 6.6 g/dL (ref 6.5–8.1)

## 2018-06-12 LAB — I-STAT BETA HCG BLOOD, ED (MC, WL, AP ONLY)

## 2018-06-12 MED ORDER — IOHEXOL 300 MG/ML  SOLN
100.0000 mL | Freq: Once | INTRAMUSCULAR | Status: AC | PRN
  Administered 2018-06-12: 100 mL via INTRAVENOUS

## 2018-06-12 MED ORDER — METHOCARBAMOL 500 MG PO TABS: 500.0000 mg | ORAL_TABLET | Freq: Three times a day (TID) | ORAL | 0 refills | Status: DC | PRN
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
## 2018-09-02 ENCOUNTER — Emergency Department
Admission: EM | Admit: 2018-09-02 | Discharge: 2018-09-03 | Discharge status: Against Medical Advice | Payer: Self-pay | Attending: Emergency Medicine | Admitting: Emergency Medicine

## 2018-09-02 ENCOUNTER — Encounter: Payer: Self-pay | Admitting: Emergency Medicine

## 2018-09-02 ENCOUNTER — Emergency Department: Payer: Self-pay

## 2018-09-02 ENCOUNTER — Other Ambulatory Visit: Payer: Self-pay

## 2018-09-02 DIAGNOSIS — R079 Chest pain, unspecified: Secondary | ICD-10-CM | POA: Insufficient documentation

## 2018-09-02 DIAGNOSIS — Z5321 Procedure and treatment not carried out due to patient leaving prior to being seen by health care provider: Secondary | ICD-10-CM | POA: Insufficient documentation

## 2018-09-02 LAB — CBC
HCT: 45.2 % (ref 36.0–46.0)
HEMOGLOBIN: 15.3 g/dL — AB (ref 12.0–15.0)
MCH: 31.6 pg (ref 26.0–34.0)
MCHC: 33.8 g/dL (ref 30.0–36.0)
MCV: 93.4 fL (ref 80.0–100.0)
Platelets: 166 10*3/uL (ref 150–400)
RBC: 4.84 MIL/uL (ref 3.87–5.11)
RDW: 11.7 % (ref 11.5–15.5)
WBC: 3.1 10*3/uL — ABNORMAL LOW (ref 4.0–10.5)
nRBC: 0 % (ref 0.0–0.2)

## 2018-09-02 LAB — BASIC METABOLIC PANEL
Anion gap: 6 (ref 5–15)
BUN: 6 mg/dL (ref 6–20)
CO2: 26 mmol/L (ref 22–32)
Calcium: 8.9 mg/dL (ref 8.9–10.3)
Chloride: 104 mmol/L (ref 98–111)
Creatinine, Ser: 0.67 mg/dL (ref 0.44–1.00)
GFR calc Af Amer: >60 mL/min (ref >60)
GFR calc non Af Amer: >60 mL/min (ref >60)
Glucose, Bld: 102 mg/dL — ABNORMAL HIGH (ref 70–99)
Potassium: 4.1 mmol/L (ref 3.5–5.1)
Sodium: 136 mmol/L (ref 135–145)

## 2018-09-02 LAB — TROPONIN I: Troponin I: <0.03 ng/mL (ref <0.03)

## 2018-09-02 MED ORDER — SODIUM CHLORIDE 0.9% FLUSH: 3.0000 mL | Freq: Once | INTRAVENOUS | Status: DC

## 2018-09-02 NOTE — ED Triage Notes (Signed)
Pt presents to ED via POV with c/o flu-like symptoms since Saturday. Pt also c/o substernal chest pain that radiates to either side of her of her chest, pt states pain lasts approx 1 minute then subsides. Pt states several episodes of chest pain since Saturday.

## 2018-09-03 LAB — INFLUENZA PANEL BY PCR (TYPE A & B)
INFLBPCR: POSITIVE — AB
Influenza A By PCR: NEGATIVE

## 2018-09-03 NOTE — ED Notes (Signed)
No answer when called several times from lobby 

## 2018-10-04 ENCOUNTER — Other Ambulatory Visit: Payer: Self-pay | Admitting: Obstetrics and Gynecology

## 2019-03-16 ENCOUNTER — Other Ambulatory Visit: Payer: Self-pay | Admitting: Obstetrics and Gynecology

## 2019-03-17 ENCOUNTER — Other Ambulatory Visit: Payer: Self-pay

## 2019-03-17 MED ORDER — LEVONORGESTREL-ETHINYL ESTRAD 90-20 MCG PO TABS: 1.0000 | ORAL_TABLET | Freq: Every day | ORAL | 0 refills | Status: DC

## 2019-03-17 NOTE — Telephone Encounter (Signed)
Patient requested pharmacy be changed to [facility] on [address] [place]. Refill sent

## 2019-03-21 ENCOUNTER — Encounter: Payer: Medicaid Other | Admitting: Obstetrics and Gynecology

## 2019-04-17 ENCOUNTER — Other Ambulatory Visit: Payer: Self-pay | Admitting: Obstetrics and Gynecology

## 2019-04-23 ENCOUNTER — Ambulatory Visit (INDEPENDENT_AMBULATORY_CARE_PROVIDER_SITE_OTHER): Payer: Medicaid Other | Admitting: Obstetrics and Gynecology

## 2019-04-23 ENCOUNTER — Encounter: Payer: Self-pay | Admitting: Obstetrics and Gynecology

## 2019-04-23 ENCOUNTER — Other Ambulatory Visit: Payer: Self-pay

## 2019-04-23 VITALS — BP 113/80 | HR 97 | Ht 63.0 in | Wt 107.2 lb

## 2019-04-23 DIAGNOSIS — Z01419 Encounter for gynecological examination (general) (routine) without abnormal findings: Secondary | ICD-10-CM

## 2019-04-23 DIAGNOSIS — Z0000 Encounter for general adult medical examination without abnormal findings: Secondary | ICD-10-CM

## 2019-04-23 DIAGNOSIS — Z23 Encounter for immunization: Secondary | ICD-10-CM

## 2019-04-23 MED ORDER — LEVONORGESTREL-ETHINYL ESTRAD 90-20 MCG PO TABS: 1.0000 | ORAL_TABLET | Freq: Every day | ORAL | 11 refills | Status: DC

## 2019-04-23 NOTE — Patient Instructions (Signed)
 Preventive Care 21-31 Years Old, Female Preventive care refers to visits with your health care provider and lifestyle choices that can promote health and wellness. This includes:  A yearly physical exam. This may also be called an annual well check.  Regular dental visits and eye exams.  Immunizations.  Screening for certain conditions.  Healthy lifestyle choices, such as eating a healthy diet, getting regular exercise, not using drugs or products that contain nicotine and tobacco, and limiting alcohol use. What can I expect for my preventive care visit? Physical exam Your health care provider will check your:  Height and weight. This may be used to calculate body mass index (BMI), which tells if you are at a healthy weight.  Heart rate and blood pressure.  Skin for abnormal spots. Counseling Your health care provider may ask you questions about your:  Alcohol, tobacco, and drug use.  Emotional well-being.  Home and relationship well-being.  Sexual activity.  Eating habits.  Work and work environment.  Method of birth control.  Menstrual cycle.  Pregnancy history. What immunizations do I need?  Influenza (flu) vaccine  This is recommended every year. Tetanus, diphtheria, and pertussis (Tdap) vaccine  You may need a Td booster every 10 years. Varicella (chickenpox) vaccine  You may need this if you have not been vaccinated. Human papillomavirus (HPV) vaccine  If recommended by your health care provider, you may need three doses over 6 months. Measles, mumps, and rubella (MMR) vaccine  You may need at least one dose of MMR. You may also need a second dose. Meningococcal conjugate (MenACWY) vaccine  One dose is recommended if you are age 19-21 years and a first-year college student living in a residence hall, or if you have one of several medical conditions. You may also need additional booster doses. Pneumococcal conjugate (PCV13) vaccine  You may need  this if you have certain conditions and were not previously vaccinated. Pneumococcal polysaccharide (PPSV23) vaccine  You may need one or two doses if you smoke cigarettes or if you have certain conditions. Hepatitis A vaccine  You may need this if you have certain conditions or if you travel or work in places where you may be exposed to hepatitis A. Hepatitis B vaccine  You may need this if you have certain conditions or if you travel or work in places where you may be exposed to hepatitis B. Haemophilus influenzae type b (Hib) vaccine  You may need this if you have certain conditions. You may receive vaccines as individual doses or as more than one vaccine together in one shot (combination vaccines). Talk with your health care provider about the risks and benefits of combination vaccines. What tests do I need?  Blood tests  Lipid and cholesterol levels. These may be checked every 5 years starting at age 20.  Hepatitis C test.  Hepatitis B test. Screening  Diabetes screening. This is done by checking your blood sugar (glucose) after you have not eaten for a while (fasting).  Sexually transmitted disease (STD) testing.  BRCA-related cancer screening. This may be done if you have a family history of breast, ovarian, tubal, or peritoneal cancers.  Pelvic exam and Pap test. This may be done every 3 years starting at age 21. Starting at age 30, this may be done every 5 years if you have a Pap test in combination with an HPV test. Talk with your health care provider about your test results, treatment options, and if necessary, the need for more   tests. Follow these instructions at home: Eating and drinking   Eat a diet that includes fresh fruits and vegetables, whole grains, lean protein, and low-fat dairy.  Take vitamin and mineral supplements as recommended by your health care provider.  Do not drink alcohol if: ? Your health care provider tells you not to drink. ? You are  pregnant, may be pregnant, or are planning to become pregnant.  If you drink alcohol: ? Limit how much you have to 0-1 drink a day. ? Be aware of how much alcohol is in your drink. In the [place] one drink equals one 12 oz bottle of beer (355 mL), one 5 oz glass of wine (148 mL), or one 1 oz glass of hard liquor (44 mL). Lifestyle  Take daily care of your teeth and gums.  Stay active. Exercise for at least 30 minutes on 5 or more days each week.  Do not use any products that contain nicotine or tobacco, such as cigarettes, e-cigarettes, and chewing tobacco. If you need help quitting, ask your health care provider.  If you are sexually active, practice safe sex. Use a condom or other form of birth control (contraception) in order to prevent pregnancy and STIs (sexually transmitted infections). If you plan to become pregnant, see your health care provider for a preconception visit. What's next?  Visit your health care provider once a year for a well check visit.  Ask your health care provider how often you should have your eyes and teeth checked.  Stay up to date on all vaccines. This information is not intended to replace advice given to you by your health care provider. Make sure you discuss any questions you have with your health care provider. Document Released: 08/29/2001 Document Revised: 03/14/2018 Document Reviewed: 03/14/2018 Elsevier Patient Education  2020 [employer].

## 2019-04-23 NOTE — Addendum Note (Signed)
Addended by: Keturah Barre L on: 04/23/2019 10:56 AM   Modules accepted: Orders

## 2019-04-23 NOTE — Progress Notes (Signed)
Subjective:   Tara Mccull[facility] is a 33 y.o. G18P0010 Caucasian female here for a routine well-woman exam.  No LMP recorded. (Menstrual status: Oral contraceptives).    Current complaints: none, considering a pregnancy this year PCP: [facility]       doesn't desire labs  Social History: Sexual: heterosexual Marital Status: married Living situation: with spouse Occupation: Emergency planning/management officer Tobacco/alcohol: has restarted smoking a few cig/week, plans to stop again Illicit drugs: no history of illicit drug use  The following portions of the patient's history were reviewed and updated as appropriate: allergies, current medications, past family history, past medical history, past social history, past surgical history and problem list.  Past Medical History Past Medical History:  Diagnosis Date  . Anxiety   . Depression   . Ovarian cyst   . Painful menstrual periods   . PTSD (post-traumatic stress disorder)   . Tobacco user   . Uses marijuana    last used 06/16/2015- previously used daily    Past Surgical History Past Surgical History:  Procedure Laterality Date  . DILATION AND EVACUATION N/A 08/02/2015   Procedure: DILATATION AND EVACUATION;  Surgeon: Brayton Mars, MD;  Location: [facility] ORS;  Service: Gynecology;  Laterality: N/A;  . WISDOM TOOTH EXTRACTION      Gynecologic History G1P0010  No LMP recorded. (Menstrual status: Oral contraceptives). Contraception: OCP (estrogen/progesterone) Last Pap: 2019. Results were: normal   Obstetric History OB History  Gravida Para Term Preterm AB Living  1       1    SAB TAB Ectopic Multiple Live Births  1            # Outcome Date GA Lbr Len/2nd Weight Sex Delivery Anes PTL Lv  1 SAB 08/12/15 [redacted]w[redacted]d       FD    Current Medications Current Outpatient Medications on File Prior to Visit  Medication Sig Dispense Refill  . levonorgestrel-ethinyl estradiol (LYBREL) 90-20 MCG tablet TAKE 1 TABLET BY MOUTH DAILY. 28 tablet 0  .  dicyclomine (BENTYL) 20 MG tablet Take 1 tablet (20 mg total) by mouth 3 (three) times daily as needed for spasms. (Patient not taking: Reported on 01/13/2018) 30 tablet 0   No current facility-administered medications on file prior to visit.     Review of Systems Patient denies any headaches, blurred vision, shortness of breath, chest pain, abdominal pain, problems with bowel movements, urination, or intercourse.  Objective:  BP 113/80   Pulse 97   Ht 5\' 3"  (1.6 m)   Wt 107 lb 3.2 oz (48.6 kg)   BMI 18.99 kg/m  Physical Exam  General:  Well developed, well nourished, no acute distress. She is alert and oriented x3. Skin:  Warm and dry Neck:  Midline trachea, no thyromegaly or nodules Cardiovascular: Regular rate and rhythm, no murmur heard Lungs:  Effort normal, all lung fields clear to auscultation bilaterally Breasts:  No dominant palpable mass, retraction, or nipple discharge Abdomen:  Soft, non tender, no hepatosplenomegaly or masses Pelvic:  External genitalia is normal in appearance.  The vagina is normal in appearance. The cervix is bulbous, no CMT.  Thin prep pap is not done. Uterus is felt to be normal size, shape, and contour.  No adnexal masses or tenderness noted. Extremities:  No swelling or varicosities noted Psych:  She has a normal mood and affect  Assessment:   Healthy well-woman exam OCP user Needs flu vaccine  Plan:  Flu vaccine given Encouraged smoking cessation. F/U 1 year for  AE, or sooner if needed   Melody Rockney Ghee, CNM

## 2019-06-28 IMAGING — CT CT HEAD W/O CM
3 series · 16 of 45 positions shown, 19 images · non-contrast
Comparison: None.

CLINICAL DATA: Seizure versus syncope.  New onset, nontraumatic.

EXAM:
CT HEAD WITHOUT CONTRAST
TECHNIQUE: Contiguous axial images were obtained from the base of the skull
through the vertex without intravenous contrast.

[Series 3: head wo · axial · 0.40mm/px · z∈[-118,-3]mm · 10 of 28 slices shown, 13 images]
[im 3/28  brain]
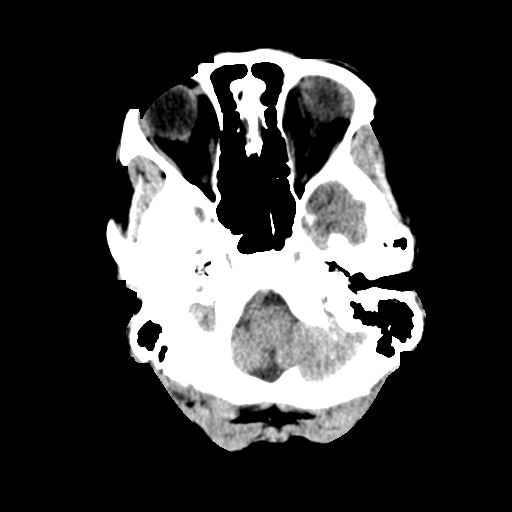
[im 3/28  bone]
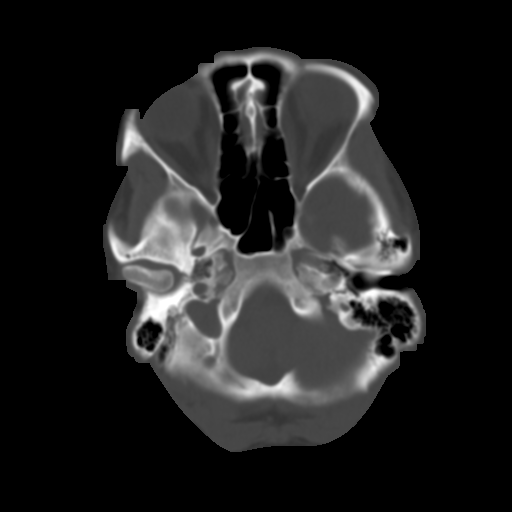
[im 5/28  brain]
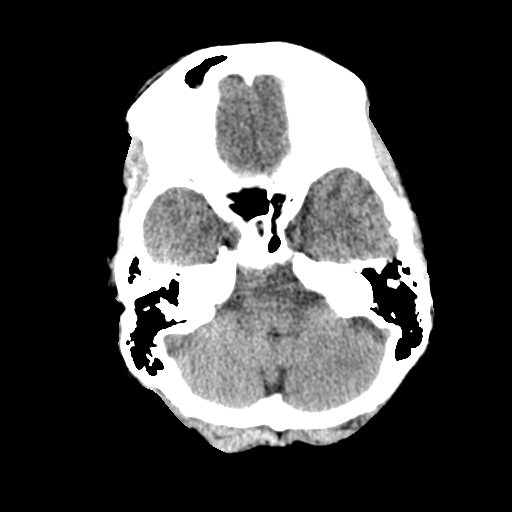
[im 8/28  brain]
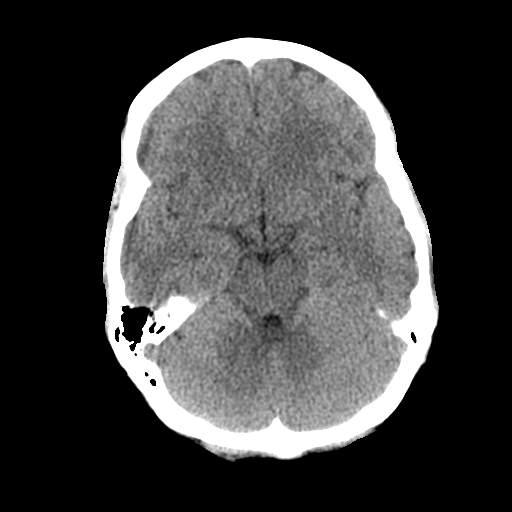
[im 11/28  brain]
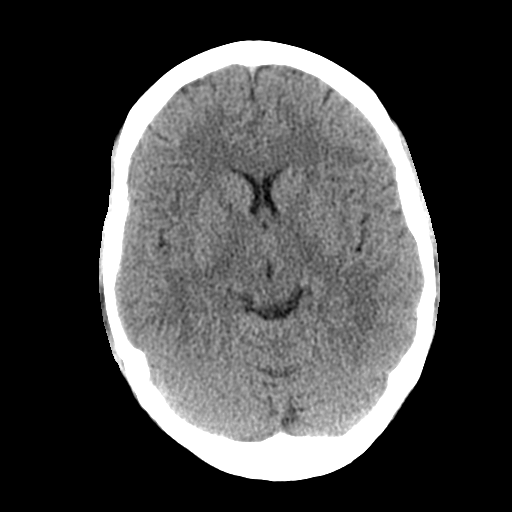
[im 13/28  brain]
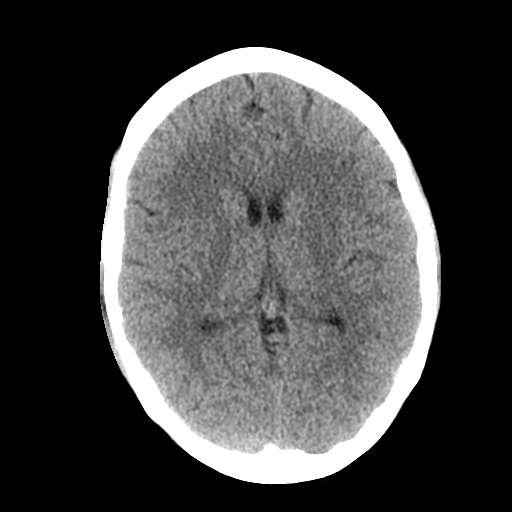
[im 13/28  bone]
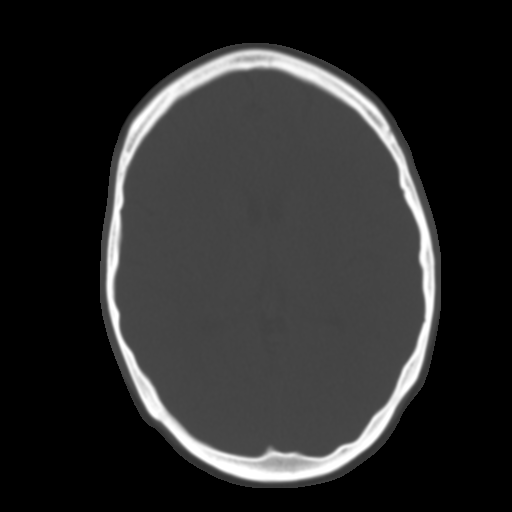
[im 16/28  brain]
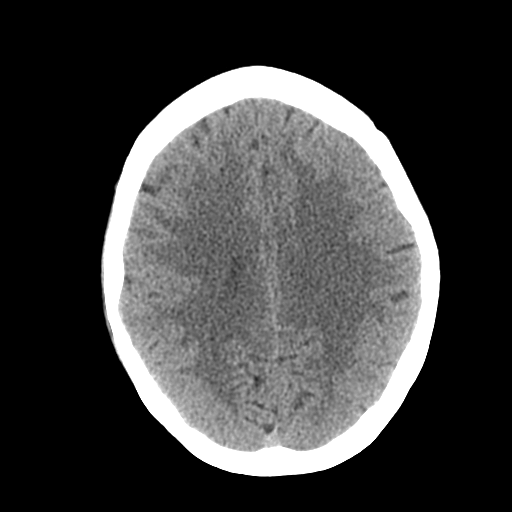
[im 18/28  brain]
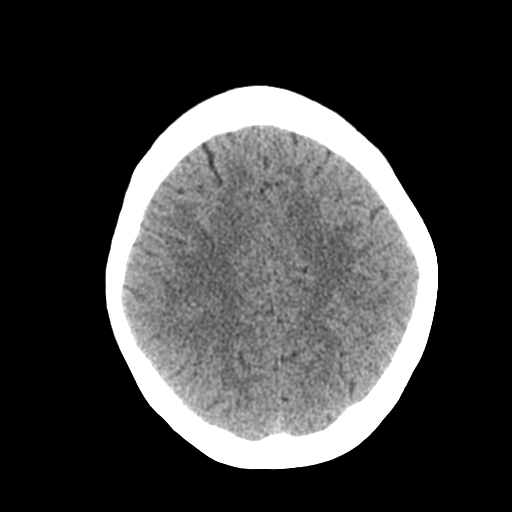
[im 21/28  brain]
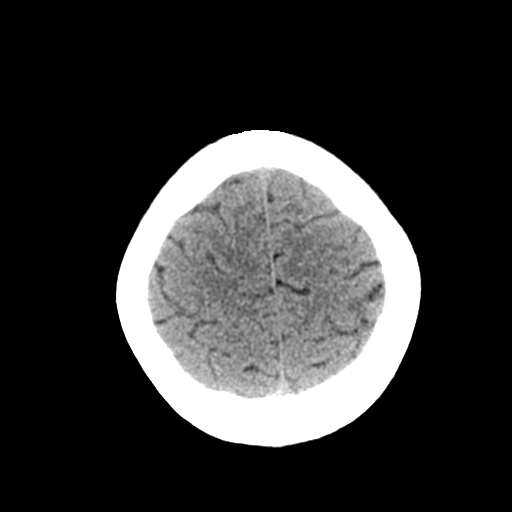
[im 24/28  brain]
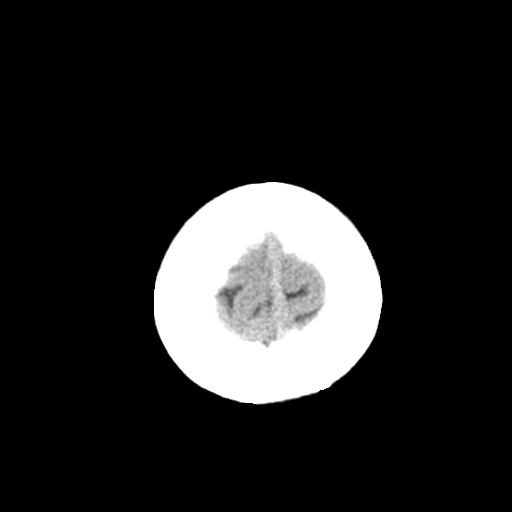
[im 24/28  bone]
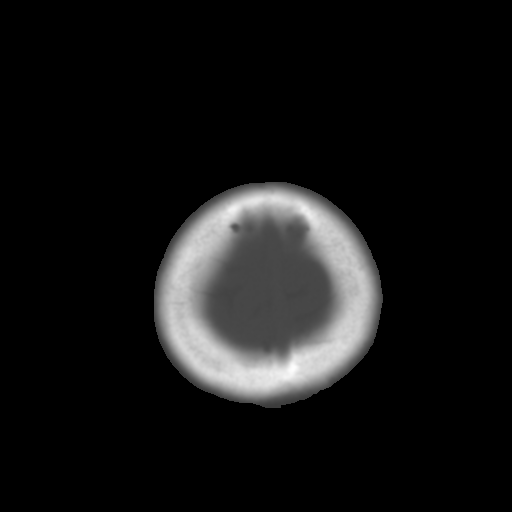
[im 26/28  brain]
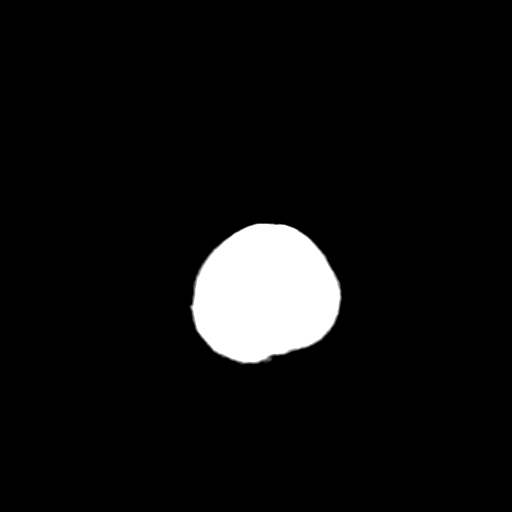

[Series 4: coronal soft tissue · coronal · 0.29mm/px · 3 of 63 slices shown]
[im 21/63  brain]
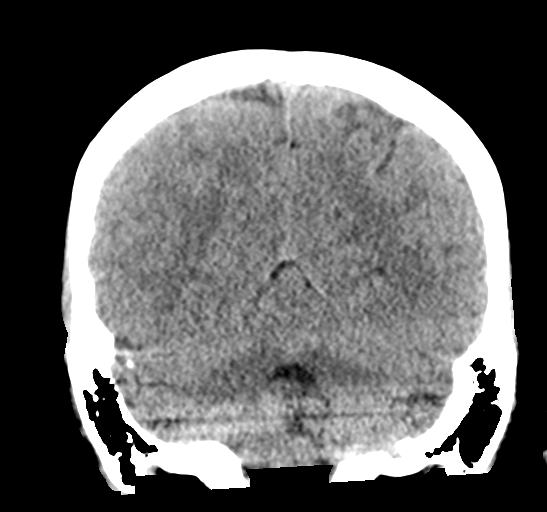
[im 28/63  brain]
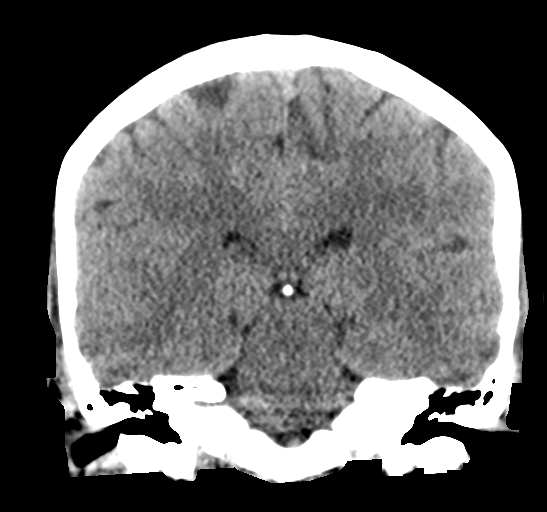
[im 35/63  brain]
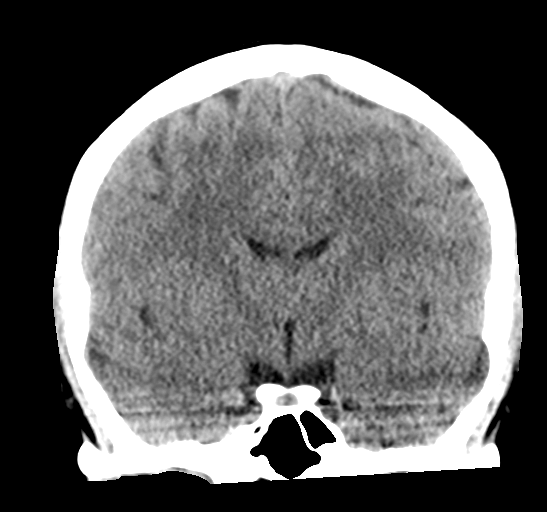

[Series 5: sagittal soft tissue · sagittal · 0.29mm/px · 3 of 53 slices shown]
[im 18/53  brain]
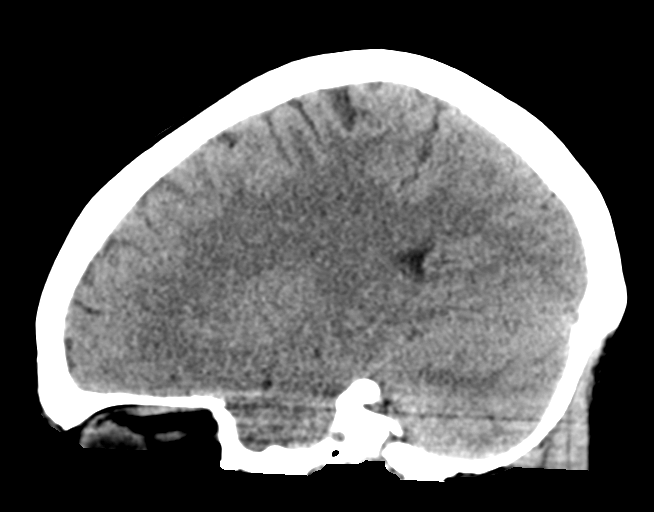
[im 27/53  brain]
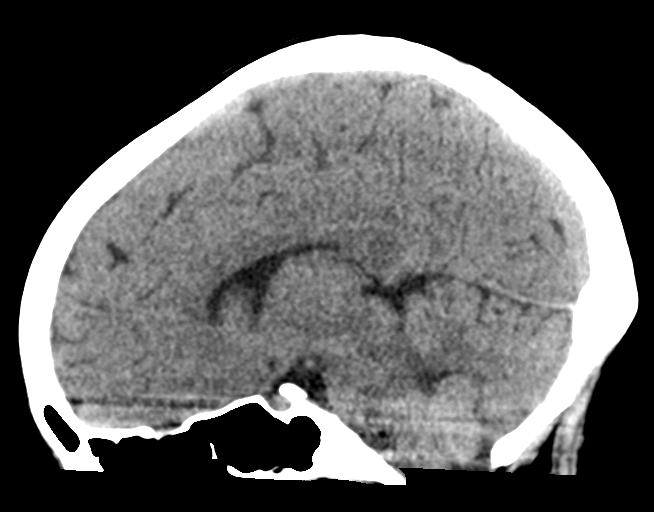
[im 35/53  brain]
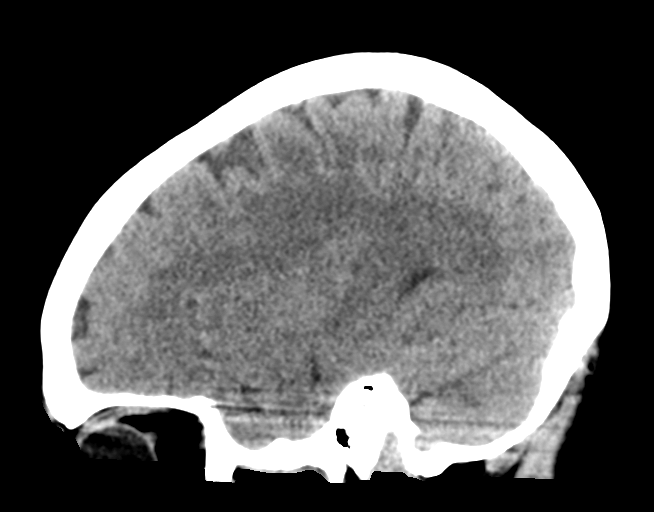

[16 of 45 positions shown; findings below may reference images not displayed]

FINDINGS: Brain: No intracranial hemorrhage, mass effect, or midline shift. No
hydrocephalus. The basilar cisterns are patent. No evidence of
territorial infarct or acute ischemia. No extra-axial or
intracranial fluid collection.

Vascular: No hyperdense vessel or unexpected calcification.

Skull: No fracture or focal lesion.

Sinuses/Orbits: Paranasal sinuses and mastoid air cells are clear.
The visualized orbits are unremarkable.

Other: None.
IMPRESSION: Unremarkable noncontrast head CT.

## 2019-06-29 IMAGING — CT CT ANGIO CHEST
2 of 6 series · 19 of 36 positions shown · IV contrast (APPLIED)
Comparison: None.

CLINICAL DATA: Multiple syncopal episodes in the past hour.

EXAM:
CT ANGIOGRAPHY CHEST WITH CONTRAST
TECHNIQUE: Multidetector CT imaging of the chest was performed using the
standard protocol during bolus administration of intravenous
contrast. Multiplanar CT image reconstructions and MIPs were
obtained to evaluate the vascular anatomy.
CONTRAST:  75mL DWZHXE-AVZ IOPAMIDOL (DWZHXE-AVZ) INJECTION 76%

[Series 6: thins · axial · 0.60mm/px · z∈[-611,-372]mm · 18 of 267 slices shown]
[im 14/267  lung]
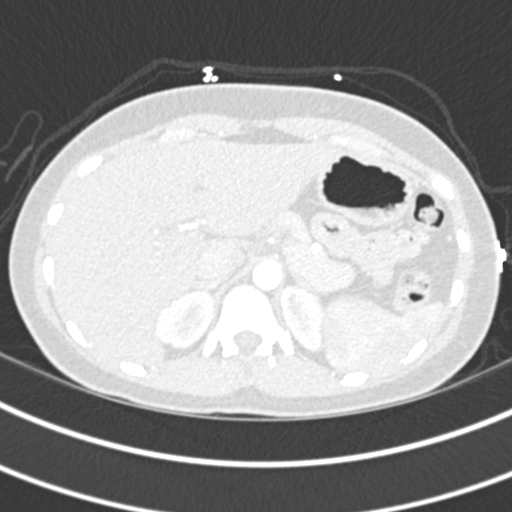
[im 27/267  mediastinal]
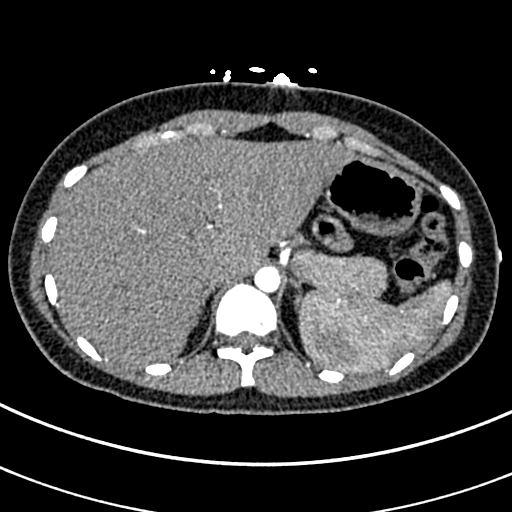
[im 40/267  lung]
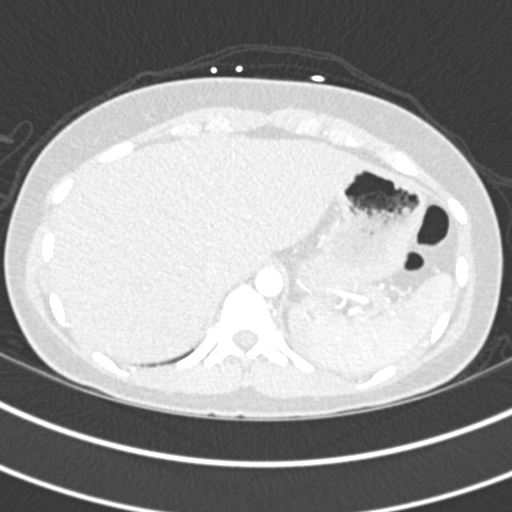
[im 54/267  mediastinal]
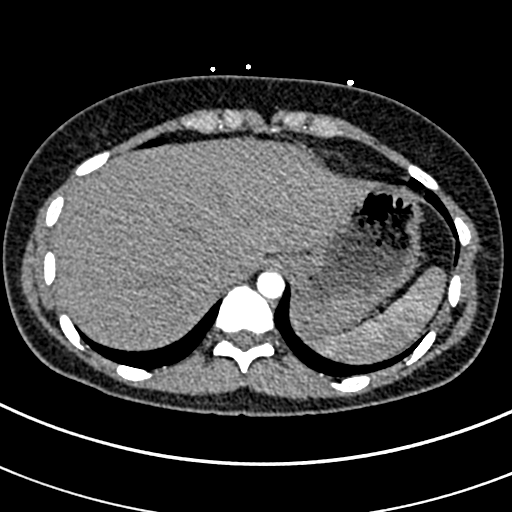
[im 67/267  lung]
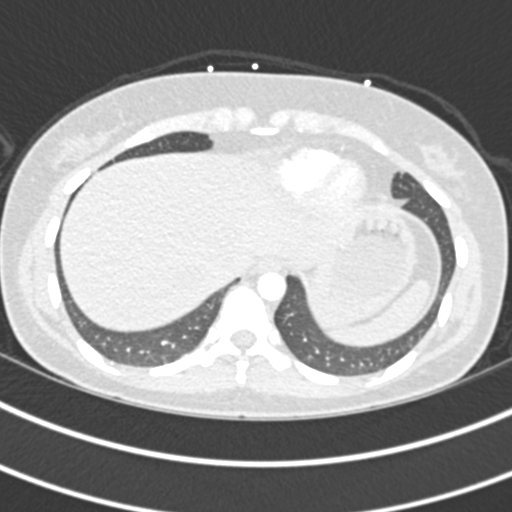
[im 80/267  mediastinal]
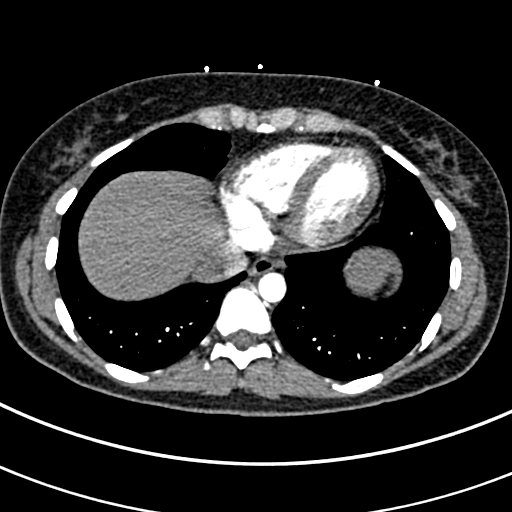
[im 94/267  lung]
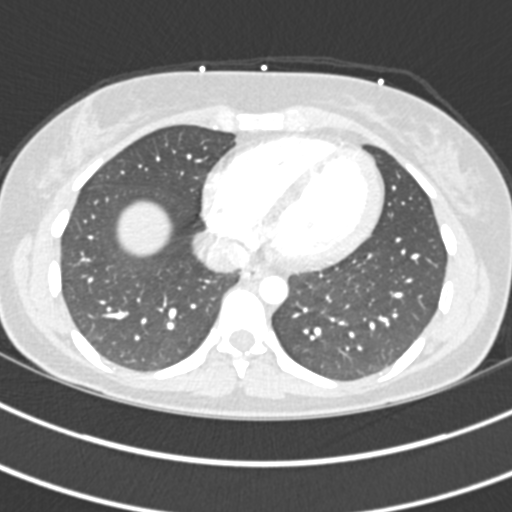
[im 107/267  mediastinal]
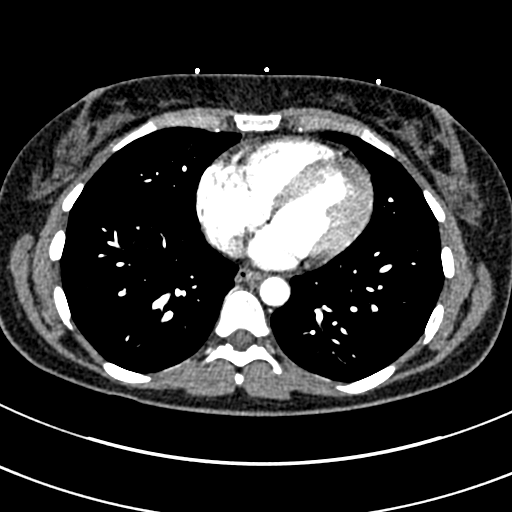
[im 120/267  lung]
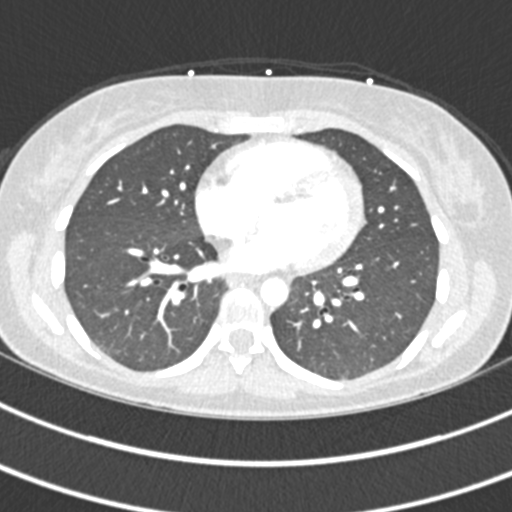
[im 147/267  mediastinal]
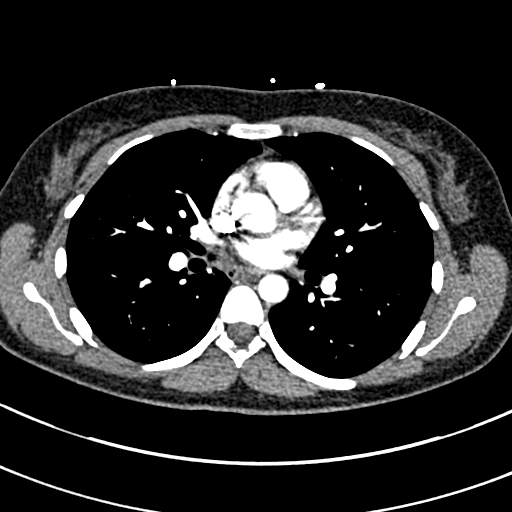
[im 160/267  lung]
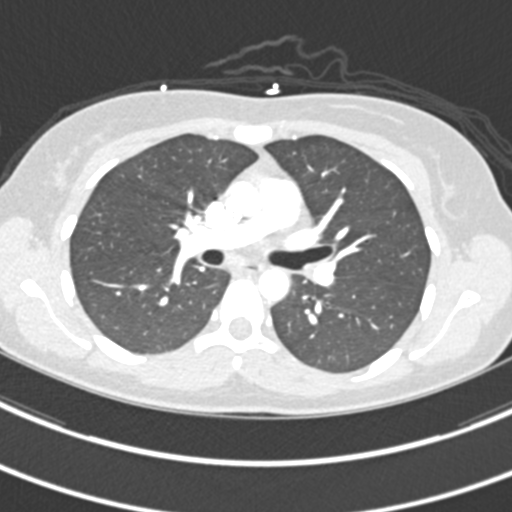
[im 173/267  mediastinal]
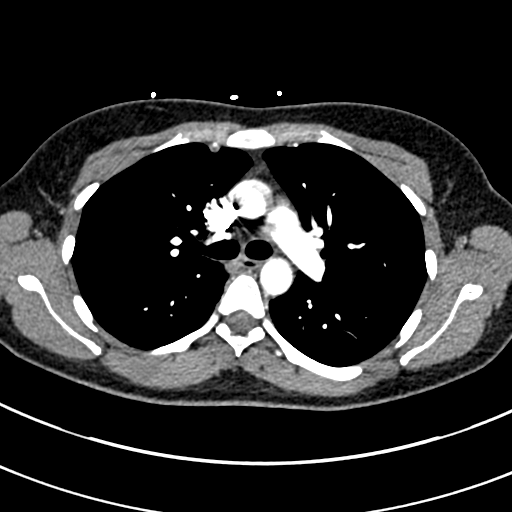
[im 187/267  lung]
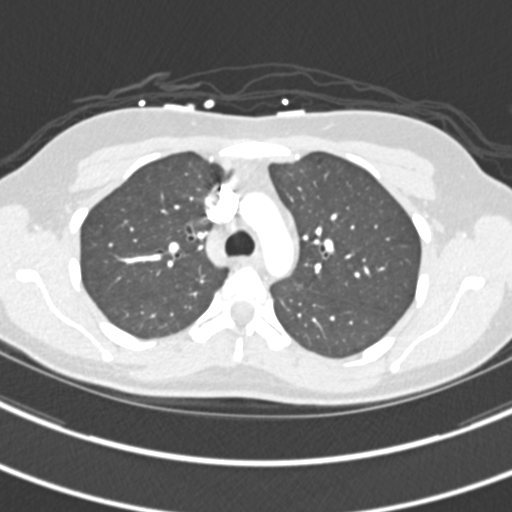
[im 200/267  mediastinal]
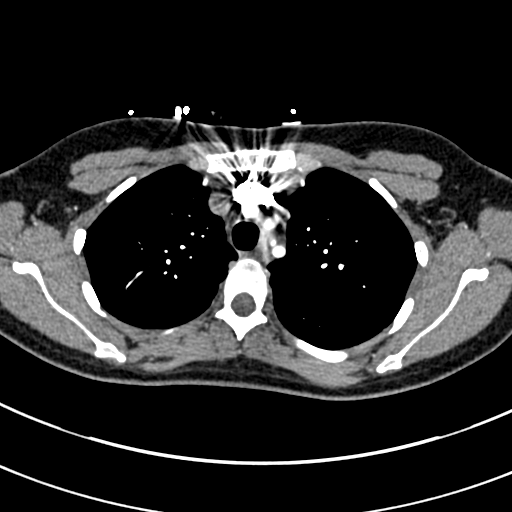
[im 213/267  lung]
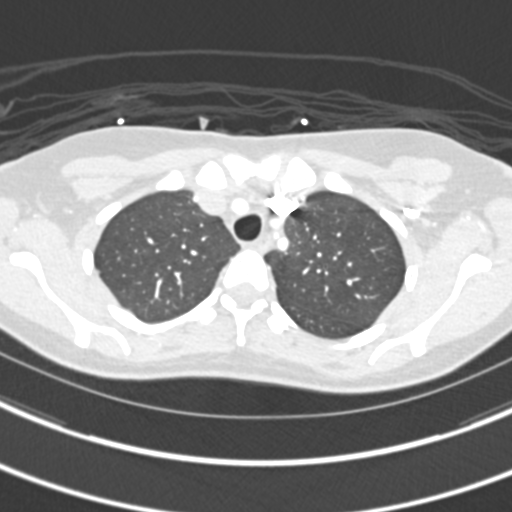
[im 227/267  mediastinal]
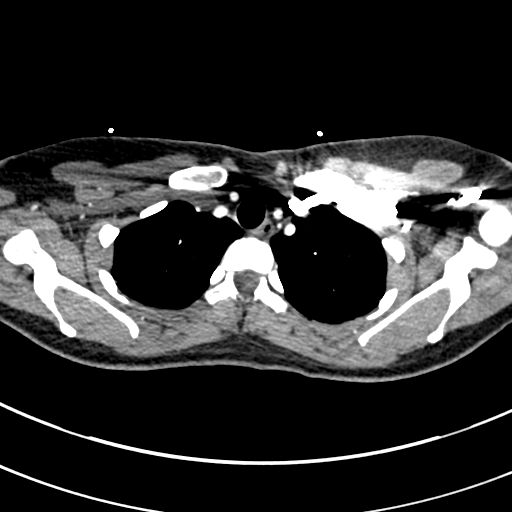
[im 240/267  lung]
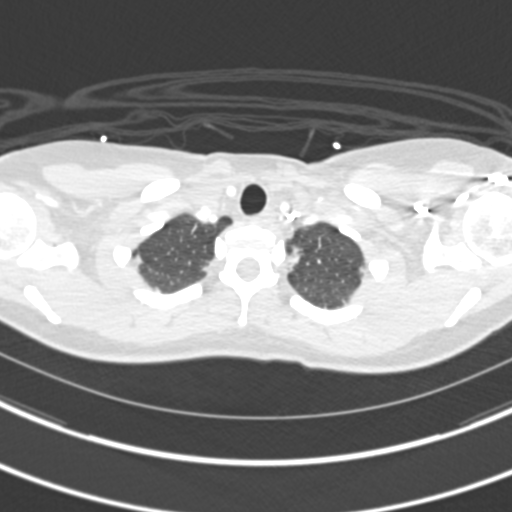
[im 253/267  mediastinal]
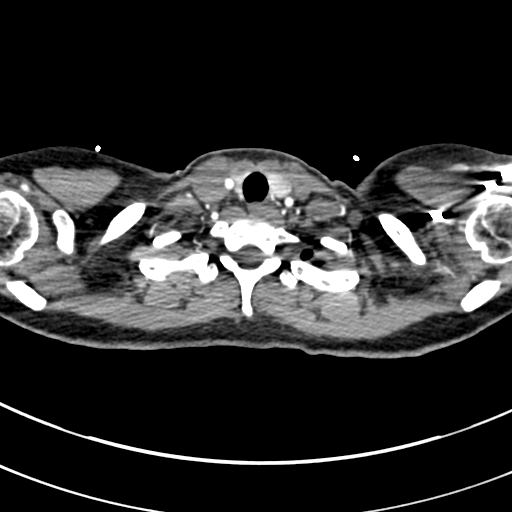

[Series 8: coronal mpr · coronal · 0.52mm/px · 1 of 70 slices shown]
[im 35/70  mediastinal]
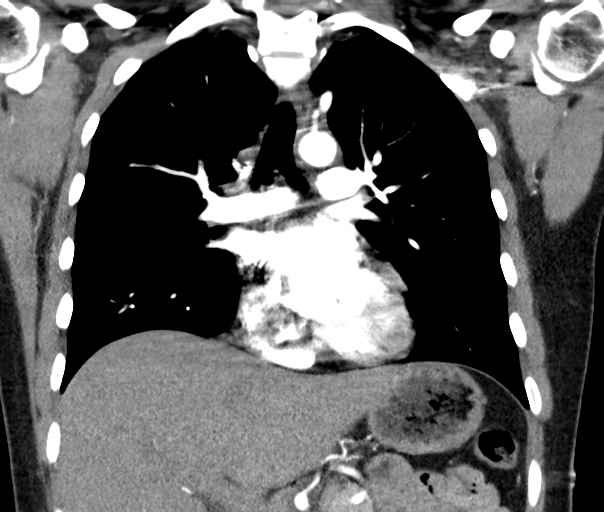

[19 of 36 positions shown; findings below may reference images not displayed]

FINDINGS: Cardiovascular: The study is of quality for the evaluation of
pulmonary embolism. There are no filling defects in the central,
lobar, segmental or subsegmental pulmonary artery branches to
suggest acute pulmonary embolism. Great vessels are normal in course
and caliber. Normal heart size. No significant pericardial
fluid/thickening.

Mediastinum/Nodes: No discrete thyroid nodules. Unremarkable
esophagus. No pathologically enlarged axillary, mediastinal or hilar
lymph nodes.

Lungs/Pleura: No pneumothorax. No pleural effusion. Minimal biapical
pleuroparenchymal scarring and/or atelectasis.

Upper abdomen: Unremarkable.

Musculoskeletal:  No aggressive appearing focal osseous lesions.

Review of the MIP images confirms the above findings.
IMPRESSION: Clear lungs without evidence of acute pulmonary embolus.

## 2020-06-24 ENCOUNTER — Telehealth: Payer: Self-pay

## 2020-06-24 NOTE — Telephone Encounter (Signed)
mychart message sent

## 2021-08-04 DIAGNOSIS — E781 Pure hyperglyceridemia: Secondary | ICD-10-CM | POA: Insufficient documentation

## 2023-01-14 ENCOUNTER — Inpatient Hospital Stay (HOSPITAL_COMMUNITY): Payer: Medicaid Other

## 2023-01-14 ENCOUNTER — Inpatient Hospital Stay (HOSPITAL_COMMUNITY)
Admission: AD | Admit: 2023-01-14 | Discharge: 2023-01-14 | Disposition: A | Discharge status: Home / Self Care | Payer: Medicaid Other | Attending: Obstetrics and Gynecology | Admitting: Obstetrics and Gynecology

## 2023-01-14 ENCOUNTER — Encounter (HOSPITAL_COMMUNITY): Payer: Self-pay | Admitting: Obstetrics and Gynecology

## 2023-01-14 DIAGNOSIS — Z3A01 Less than 8 weeks gestation of pregnancy: Secondary | ICD-10-CM | POA: Diagnosis not present

## 2023-01-14 DIAGNOSIS — R1084 Generalized abdominal pain: Secondary | ICD-10-CM | POA: Diagnosis present

## 2023-01-14 DIAGNOSIS — O3680X0 Pregnancy with inconclusive fetal viability, not applicable or unspecified: Secondary | ICD-10-CM | POA: Insufficient documentation

## 2023-01-14 DIAGNOSIS — R1030 Lower abdominal pain, unspecified: Secondary | ICD-10-CM | POA: Diagnosis not present

## 2023-01-14 DIAGNOSIS — O26891 Other specified pregnancy related conditions, first trimester: Secondary | ICD-10-CM | POA: Diagnosis present

## 2023-01-14 HISTORY — DX: Headache, unspecified: R51.9

## 2023-01-14 LAB — URINALYSIS, ROUTINE W REFLEX MICROSCOPIC
Bilirubin Urine: NEGATIVE
Glucose, UA: NEGATIVE mg/dL
Hgb urine dipstick: NEGATIVE
Ketones, ur: NEGATIVE mg/dL
Leukocytes,Ua: NEGATIVE
Nitrite: NEGATIVE
Protein, ur: NEGATIVE mg/dL
Specific Gravity, Urine: 1.002 — ABNORMAL LOW (ref 1.005–1.030)
pH: 5 (ref 5.0–8.0)

## 2023-01-14 LAB — WET PREP, GENITAL
Clue Cells Wet Prep HPF POC: NONE SEEN
Sperm: NONE SEEN
Trich, Wet Prep: NONE SEEN
WBC, Wet Prep HPF POC: <10 (ref <10)
Yeast Wet Prep HPF POC: NONE SEEN

## 2023-01-14 LAB — CBC
HCT: 42.7 % (ref 36.0–46.0)
Hemoglobin: 14.7 g/dL (ref 12.0–15.0)
MCH: 31.6 pg (ref 26.0–34.0)
MCHC: 34.4 g/dL (ref 30.0–36.0)
MCV: 91.8 fL (ref 80.0–100.0)
Platelets: 318 10*3/uL (ref 150–400)
RBC: 4.65 MIL/uL (ref 3.87–5.11)
RDW: 12.1 % (ref 11.5–15.5)
WBC: 9.8 10*3/uL (ref 4.0–10.5)
nRBC: 0 % (ref 0.0–0.2)

## 2023-01-14 LAB — HCG, QUANTITATIVE, PREGNANCY: hCG, Beta Chain, Quant, S: 6640 m[IU]/mL — ABNORMAL HIGH (ref <5)

## 2023-01-14 NOTE — Discharge Instructions (Signed)
Return to care  If you have heavier bleeding that soaks through more than 2 pads per hour for an hour or more If you bleed so much that you feel like you might pass out or you do pass out If you have significant abdominal pain that is not improved with Tylenol      [facility]iders          [facility]  [address], [place], [place] [address]  [phone]  [facility]  [address], [place], [place] [address]  [phone]  [facility]  [address], [place], [place] [address]  [phone]  [facility] [address], [place], [place] [address] [phone]  [facility] [place]  [address], [place], [place] [address]  [phone]  [facility]  [address]First floor), [place], [place] [address]  [phone]  [facility]  [address], [place], [place] [address]  [phone]  [facility]  [address], [place], [place] [address]  [phone]  [facility]  [address], [place], [place] [address]  [phone]  [facility]  [address], [place], [place] [address]  [phone]  [facility]  [address], [place], [place] [address]  [phone]  [facility]  [address], [place], [place] [address]  [phone]  [facility]   [address], [place], [place] [address]  [phone]  [facility]  [address], [place], [place] [address]   [phone]  [facility] [address], [place],  [address] [phone]  [facility]  [address], [place],  [address]  [phone]         

## 2023-01-14 NOTE — MAU Provider Note (Signed)
History     [id number]  Arrival date and time: 01/14/23 1623    Chief Complaint  Patient presents with   Abdominal Pain     HPI Esterlene Simi is a 37 y.o. at [redacted]w[redacted]d who presents for abdominal pain.  Reports intermittent sharp pains in various parts of her abdomen. Denies vaginal bleeding. Denies vomiting, diarrhea, or constipation.   OB History     Gravida  2   Para      Term      Preterm      AB  1   Living         SAB  1   IAB      Ectopic      Multiple      Live Births              Past Medical History:  Diagnosis Date   Anxiety    Depression    Headache    Ovarian cyst    Painful menstrual periods    PTSD (post-traumatic stress disorder)    Tobacco user    Uses marijuana    last used 06/16/2015- previously used daily    Past Surgical History:  Procedure Laterality Date   DILATION AND EVACUATION N/A 08/02/2015   Procedure: DILATATION AND EVACUATION;  Surgeon: Herold Harms, MD;  Location: [facility];  Service: Gynecology;  Laterality: N/A;   WISDOM TOOTH EXTRACTION      Family History  Problem Relation Age of Onset   Healthy Mother    Hypertension Father    Heart disease Father    Diabetes Maternal Aunt    Heart attack Paternal Uncle    Heart disease Maternal Grandmother    Lung cancer Maternal Grandmother    Diabetes Maternal Grandfather    Heart disease Paternal Grandmother    Cancer Neg Hx     Allergies  Allergen Reactions   Amoxicillin     Developed c-diff while taking, was told to mention    No current facility-administered medications on file prior to encounter.   Current Outpatient Medications on File Prior to Encounter  Medication Sig Dispense Refill   ARIPiprazole (ABILIFY) 2 MG tablet Take by mouth.     hydrOXYzine (ATARAX) 10 MG tablet SMARTSIG:0.5-1 Tablet(s) By Mouth 1-3 Times Daily PRN     Prenatal Vit-Fe Fumarate-FA (MULTIVITAMIN-PRENATAL) 27-0.8 MG TABS tablet Take 1 tablet by mouth daily at 12 noon.      Vitamin D, Ergocalciferol, (DRISDOL) 1.25 MG (50000 UNIT) CAPS capsule Take 50,000 Units by mouth once a week.       ROS Pertinent positives and negative per HPI, all others reviewed and negative  Physical Exam   BP 109/72 (BP Location: Right Arm)   Pulse 100   Temp 98.2 F (36.8 C) (Oral)   Resp 16   Ht 5\' 3"  (1.6 m)   Wt 57.2 kg   LMP 12/10/2022   SpO2 100%   BMI 22.36 kg/m   Patient Vitals for the past 24 hrs:  BP Temp Temp src Pulse Resp SpO2 Height Weight  01/14/23 1648 109/72 98.2 F (36.8 C) Oral 100 16 100 % 5\' 3"  (1.6 m) 57.2 kg    Physical Exam Vitals and nursing note reviewed.  Constitutional:      General: She is not in acute distress.    Appearance: She is well-developed. She is not ill-appearing.  HENT:     Head: Normocephalic and atraumatic.  Eyes:     General: No  scleral icterus.       Right eye: No discharge.        Left eye: No discharge.     Conjunctiva/sclera: Conjunctivae normal.  Pulmonary:     Effort: Pulmonary effort is normal. No respiratory distress.  Neurological:     General: No focal deficit present.     Mental Status: She is alert.  Psychiatric:        Mood and Affect: Mood normal.        Behavior: Behavior normal.        Labs Results for orders placed or performed during the hospital encounter of 01/14/23 (from the past 24 hour(s))  Urinalysis, Routine w reflex microscopic -Urine, Clean Catch     Status: Abnormal   Collection Time: 01/14/23  5:11 PM  Result Value Ref Range   Color, Urine COLORLESS (A) YELLOW   APPearance CLEAR CLEAR   Specific Gravity, Urine 1.002 (L) 1.005 - 1.030   pH 5.0 5.0 - 8.0   Glucose, UA NEGATIVE NEGATIVE mg/dL   Hgb urine dipstick NEGATIVE NEGATIVE   Bilirubin Urine NEGATIVE NEGATIVE   Ketones, ur NEGATIVE NEGATIVE mg/dL   Protein, ur NEGATIVE NEGATIVE mg/dL   Nitrite NEGATIVE NEGATIVE   Leukocytes,Ua NEGATIVE NEGATIVE  CBC     Status: None   Collection Time: 01/14/23  5:46 PM  Result  Value Ref Range   WBC 9.8 4.0 - 10.5 K/uL   RBC 4.65 3.87 - 5.11 MIL/uL   Hemoglobin 14.7 12.0 - 15.0 g/dL   HCT 53.6 64.4 - 03.4 %   MCV 91.8 80.0 - 100.0 fL   MCH 31.6 26.0 - 34.0 pg   MCHC 34.4 30.0 - 36.0 g/dL   RDW 74.2 59.5 - 63.8 %   Platelets 318 150 - 400 K/uL   nRBC 0.0 0.0 - 0.2 %  hCG, quantitative, pregnancy     Status: Abnormal   Collection Time: 01/14/23  5:46 PM  Result Value Ref Range   hCG, Beta Chain, Quant, S 6,640 (H) <5 mIU/mL  ABO/Rh     Status: None   Collection Time: 01/14/23  5:46 PM  Result Value Ref Range   ABO/RH(D) O NEG    Antibody Screen      NEG Performed at [facility], [address] [place], [place] [address]   Wet prep, genital     Status: None   Collection Time: 01/14/23  5:52 PM  Result Value Ref Range   Yeast Wet Prep HPF POC NONE SEEN NONE SEEN   Trich, Wet Prep NONE SEEN NONE SEEN   Clue Cells Wet Prep HPF POC NONE SEEN NONE SEEN   WBC, Wet Prep HPF POC <10 <10   Sperm NONE SEEN     Imaging US OB LESS THAN 14 WEEKS WITH OB TRANSVAGINAL  Result Date: 01/14/2023 CLINICAL DATA:  Lower abdominal cramps.  LMP 12/10/2022 EXAM: OBSTETRIC <14 WK [place] AND TRANSVAGINAL OB US TECHNIQUE: Both transabdominal and transvaginal ultrasound examinations were performed for complete evaluation of the gestation as well as the maternal uterus, adnexal regions, and pelvic cul-de-sac. Transvaginal technique was performed to assess early pregnancy. COMPARISON:  None Available. FINDINGS: Intrauterine gestational sac: Single Yolk sac:  Visualized. Embryo:  Not Visualized. MSD: 5.9 mm   5 w   2 d Subchorionic hemorrhage:  None visualized. Maternal uterus/adnexae: Within normal limits. IMPRESSION: Intrauterine pregnancy of unknown viability at estimated gestational age of [redacted] weeks 2 days. Recommend follow-up ultrasound in 14 days. Electronically  Signed   By: Sherron Ales M.D.   On: 01/14/2023 19:10    MAU Course  Procedures Lab Orders         Wet prep,  genital         Urinalysis, Routine w reflex microscopic -Urine, Clean Catch         CBC         hCG, quantitative, pregnancy    No orders of the defined types were placed in this encounter.  Imaging Orders         US OB LESS THAN 14 WEEKS WITH OB TRANSVAGINAL      MDM +UPT UA, wet prep, GC/chlamydia, CBC, ABO/Rh, quant hCG, and [place] today to rule out ectopic pregnancy which can be life threatening.   Assessment and Plan   1. Pregnancy with inconclusive fetal viability, single or unspecified fetus   2. [redacted] weeks gestation of pregnancy    -Ultrasound shows IUGS with yolk sac. Scheduled for outpatient viability scan in office in 2 weeks. Reviewed return precautions.   Judeth Horn, NP 01/14/23 9:24 PM

## 2023-01-14 NOTE — MAU Note (Addendum)
Tara Mccullough is a 37 y.o. at [redacted]w[redacted]d here in MAU reporting: been having cramping in lower abd.  When had preg confirmation, asked about this, was told it could be normal. Today, started having pain to the rt of her belly button.  Boobs aren't really hurting anymore, they had been really sensitive. No bleeding LMP: 5/26 Onset of complaint: few days Pain score: cramping 4/ 4 Vitals:   01/14/23 1648  BP: 109/72  Pulse: 100  Resp: 16  Temp: 98.2 F (36.8 C)  SpO2: 100%      Lab orders placed from triage:  urine  Hx obtained, pt in family rm

## 2023-01-15 LAB — GC/CHLAMYDIA PROBE AMP (~~LOC~~) NOT AT ARMC
Chlamydia: NEGATIVE
Comment: NEGATIVE
Comment: NORMAL
Neisseria Gonorrhea: NEGATIVE

## 2023-01-15 LAB — ABO/RH
ABO/RH(D): O NEG
Antibody Screen: NEGATIVE

## 2023-01-29 ENCOUNTER — Other Ambulatory Visit: Payer: Self-pay | Admitting: *Deleted

## 2023-01-29 DIAGNOSIS — O3680X0 Pregnancy with inconclusive fetal viability, not applicable or unspecified: Secondary | ICD-10-CM

## 2023-01-30 ENCOUNTER — Other Ambulatory Visit: Payer: Medicaid Other

## 2023-01-31 ENCOUNTER — Ambulatory Visit (INDEPENDENT_AMBULATORY_CARE_PROVIDER_SITE_OTHER): Payer: Medicaid Other

## 2023-01-31 DIAGNOSIS — Z3A01 Less than 8 weeks gestation of pregnancy: Secondary | ICD-10-CM

## 2023-01-31 DIAGNOSIS — Z3481 Encounter for supervision of other normal pregnancy, first trimester: Secondary | ICD-10-CM | POA: Diagnosis not present

## 2023-01-31 DIAGNOSIS — O3680X0 Pregnancy with inconclusive fetal viability, not applicable or unspecified: Secondary | ICD-10-CM

## 2024-07-22 DIAGNOSIS — R1031 Right lower quadrant pain: Secondary | ICD-10-CM | POA: Diagnosis present

## 2024-07-22 DIAGNOSIS — R110 Nausea: Secondary | ICD-10-CM | POA: Diagnosis not present

## 2024-07-22 DIAGNOSIS — Z5321 Procedure and treatment not carried out due to patient leaving prior to being seen by health care provider: Secondary | ICD-10-CM | POA: Diagnosis not present
